# Patient Record
Sex: Female | Born: 1980 | Race: White | Hispanic: No | Marital: Single | Smoking: Current every day smoker
Health system: Southern US, Community
[De-identification: ages and names within clinical notes are randomized; demographics above are authoritative.]

## PROBLEM LIST (undated history)

## (undated) DIAGNOSIS — G8929 Other chronic pain: Secondary | ICD-10-CM

## (undated) DIAGNOSIS — F419 Anxiety disorder, unspecified: Secondary | ICD-10-CM

## (undated) DIAGNOSIS — G43909 Migraine, unspecified, not intractable, without status migrainosus: Secondary | ICD-10-CM

## (undated) DIAGNOSIS — G4700 Insomnia, unspecified: Secondary | ICD-10-CM

## (undated) DIAGNOSIS — F32A Depression, unspecified: Secondary | ICD-10-CM

## (undated) HISTORY — PX: DILATION AND CURETTAGE OF UTERUS: SHX78

## (undated) HISTORY — PX: TUBAL LIGATION: SHX77

## (undated) HISTORY — DX: Insomnia, unspecified: G47.00

## (undated) HISTORY — DX: Anxiety disorder, unspecified: F41.9

---
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
## 2005-06-16 ENCOUNTER — Emergency Department (HOSPITAL_COMMUNITY): Admission: EM | Admit: 2005-06-16 | Discharge: 2005-06-16 | Payer: Self-pay | Admitting: Emergency Medicine

## 2005-10-17 ENCOUNTER — Emergency Department (HOSPITAL_COMMUNITY): Admission: EM | Admit: 2005-10-17 | Discharge: 2005-10-17 | Payer: Self-pay | Admitting: Emergency Medicine

## 2007-09-13 ENCOUNTER — Emergency Department (HOSPITAL_COMMUNITY): Admission: EM | Admit: 2007-09-13 | Discharge: 2007-09-13 | Payer: Self-pay | Admitting: Family Medicine

## 2007-09-14 ENCOUNTER — Emergency Department (HOSPITAL_COMMUNITY): Admission: EM | Admit: 2007-09-14 | Discharge: 2007-09-14 | Payer: Self-pay | Admitting: Emergency Medicine

## 2008-06-22 ENCOUNTER — Emergency Department (HOSPITAL_COMMUNITY): Admission: EM | Admit: 2008-06-22 | Discharge: 2008-06-22 | Payer: Self-pay | Admitting: Emergency Medicine

## 2008-06-29 ENCOUNTER — Emergency Department (HOSPITAL_BASED_OUTPATIENT_CLINIC_OR_DEPARTMENT_OTHER): Admission: EM | Admit: 2008-06-29 | Discharge: 2008-06-29 | Payer: Self-pay | Admitting: Emergency Medicine

## 2009-10-05 ENCOUNTER — Ambulatory Visit: Payer: Self-pay | Admitting: Family Medicine

## 2009-10-05 DIAGNOSIS — N3000 Acute cystitis without hematuria: Secondary | ICD-10-CM | POA: Insufficient documentation

## 2009-10-05 LAB — CONVERTED CEMR LAB
Nitrite: POSITIVE
Protein, U semiquant: 100
pH: 7

## 2009-10-06 ENCOUNTER — Encounter: Payer: Self-pay | Admitting: Family Medicine

## 2009-11-22 ENCOUNTER — Emergency Department (HOSPITAL_BASED_OUTPATIENT_CLINIC_OR_DEPARTMENT_OTHER): Admission: EM | Admit: 2009-11-22 | Discharge: 2009-11-22 | Payer: Self-pay | Admitting: Emergency Medicine

## 2009-11-27 ENCOUNTER — Emergency Department (HOSPITAL_BASED_OUTPATIENT_CLINIC_OR_DEPARTMENT_OTHER): Admission: EM | Admit: 2009-11-27 | Discharge: 2009-11-28 | Payer: Self-pay | Admitting: Emergency Medicine

## 2009-11-29 ENCOUNTER — Ambulatory Visit (HOSPITAL_BASED_OUTPATIENT_CLINIC_OR_DEPARTMENT_OTHER): Admission: RE | Admit: 2009-11-29 | Discharge: 2009-11-29 | Payer: Self-pay | Admitting: Emergency Medicine

## 2009-11-29 ENCOUNTER — Ambulatory Visit: Payer: Self-pay | Admitting: Diagnostic Radiology

## 2010-01-09 IMAGING — CT CT PELVIS W/O CM
2 of 4 series · 17 of 46 positions shown, 19 images · non-contrast
Comparison: None

CT ABDOMEN

CLINICAL DATA: Bilateral flank pain.  Abdominal pain and nausea
and vomiting.

CT ABDOMEN AND PELVIS WITHOUT CONTRAST
TECHNIQUE: Multidetector CT imaging of the abdomen and pelvis was
performed following the standard
protocol without intravenous contrast.

[Series 2: 160 stone 5.0 b40f st · axial · 0.60mm/px · z∈[-412,-96]mm · 14 of 69 slices shown, 16 images]
[im 3/69  soft-tissue]
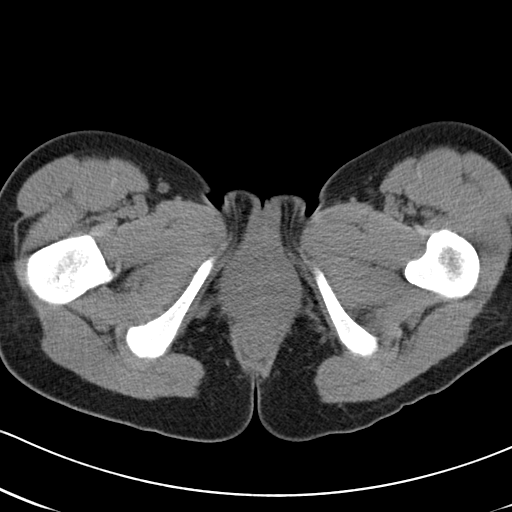
[im 3/69  bone]
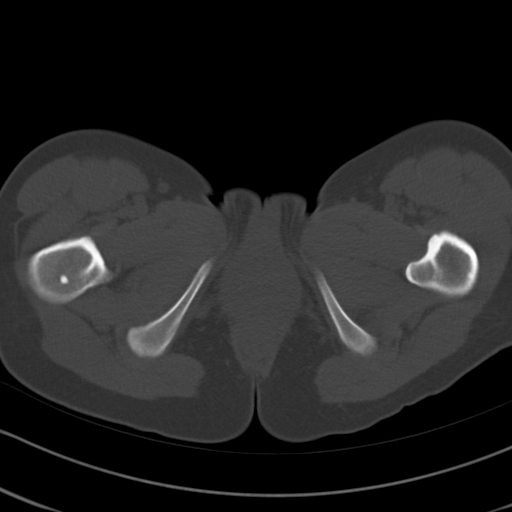
[im 8/69  soft-tissue]
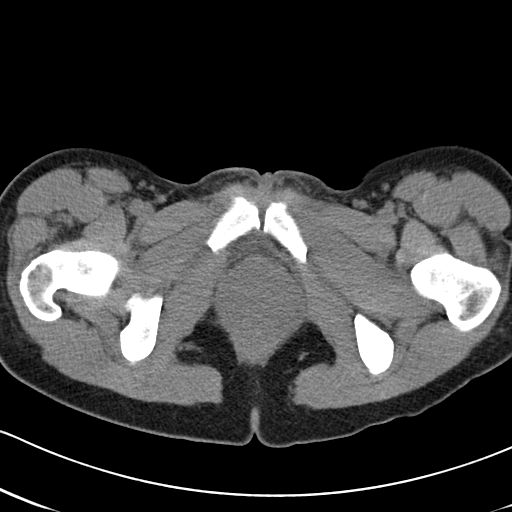
[im 13/69  soft-tissue]
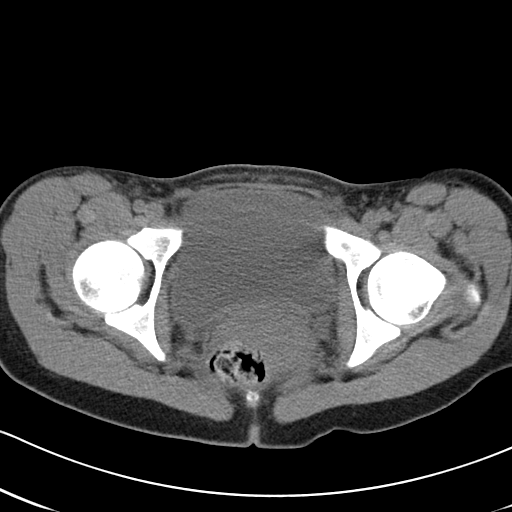
[im 18/69  soft-tissue]
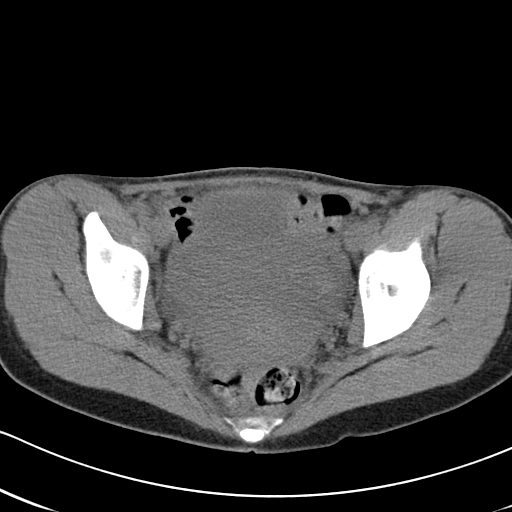
[im 22/69  soft-tissue]
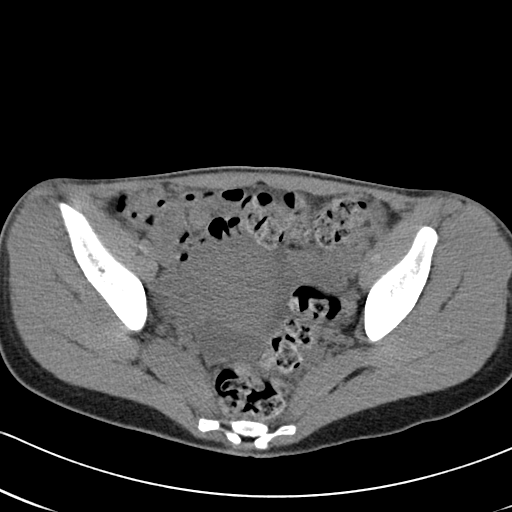
[im 27/69  soft-tissue]
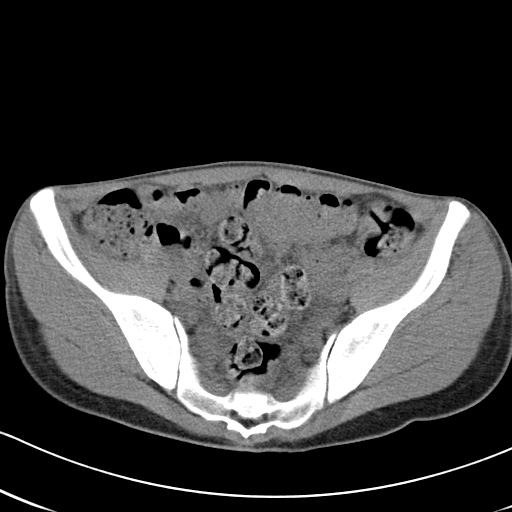
[im 32/69  soft-tissue]
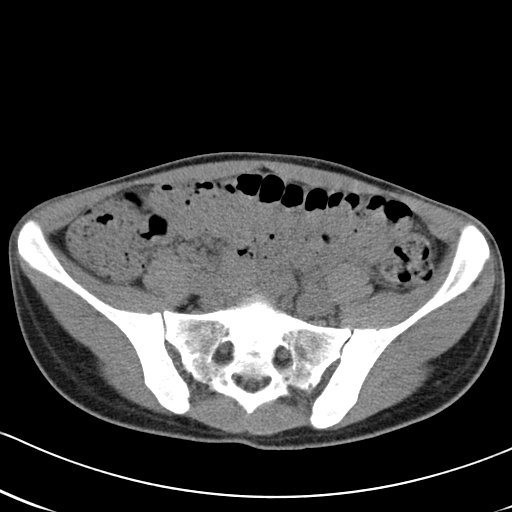
[im 37/69  soft-tissue]
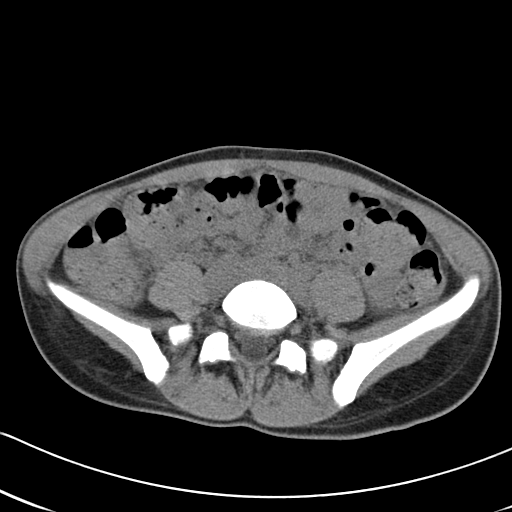
[im 42/69  soft-tissue]
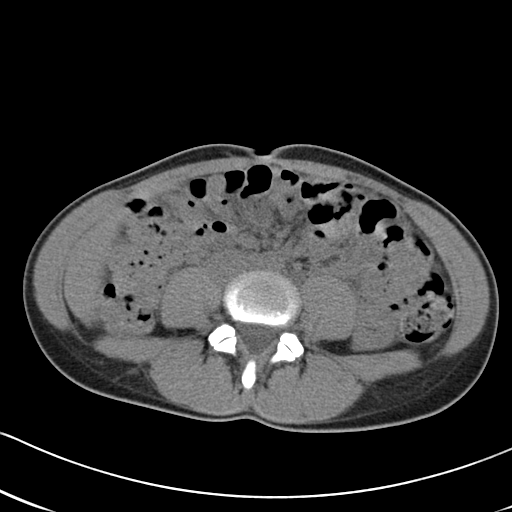
[im 42/69  bone]
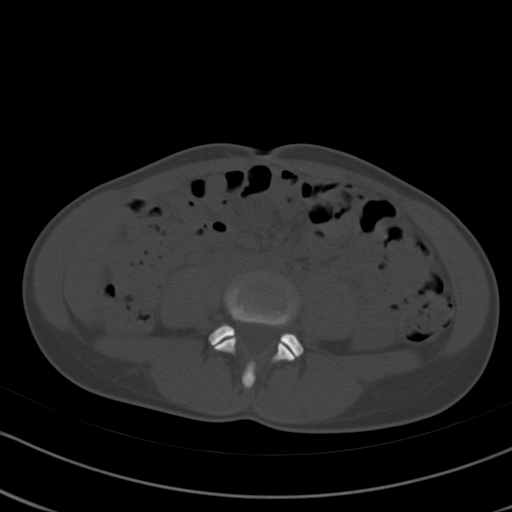
[im 47/69  soft-tissue]
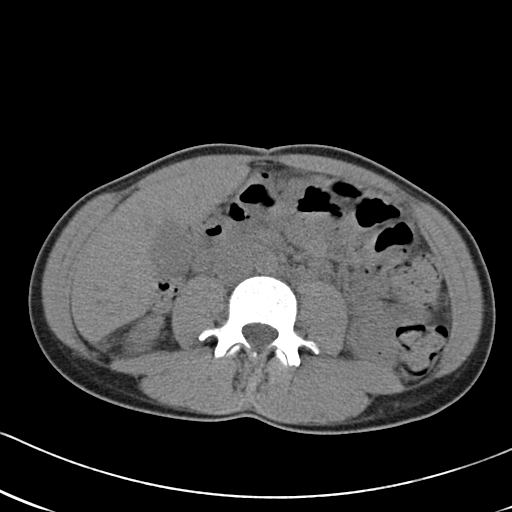
[im 51/69  soft-tissue]
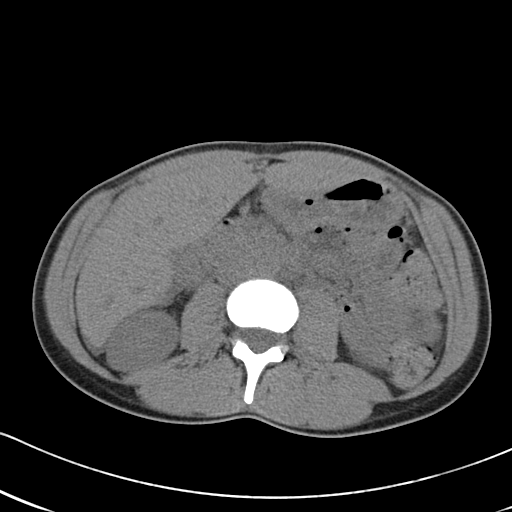
[im 56/69  soft-tissue]
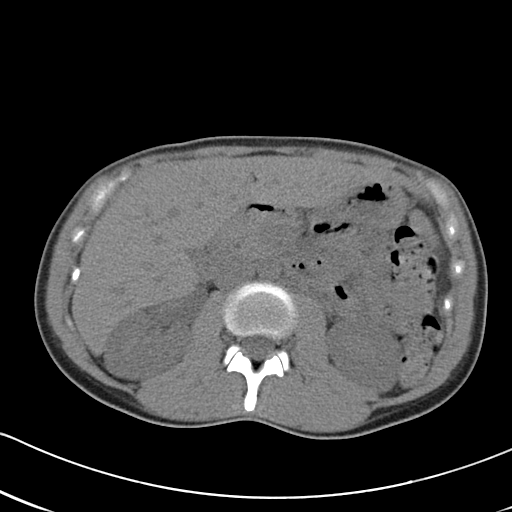
[im 61/69  soft-tissue]
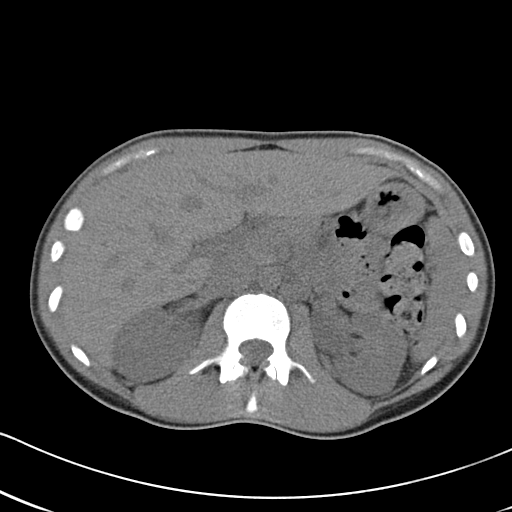
[im 66/69  soft-tissue]
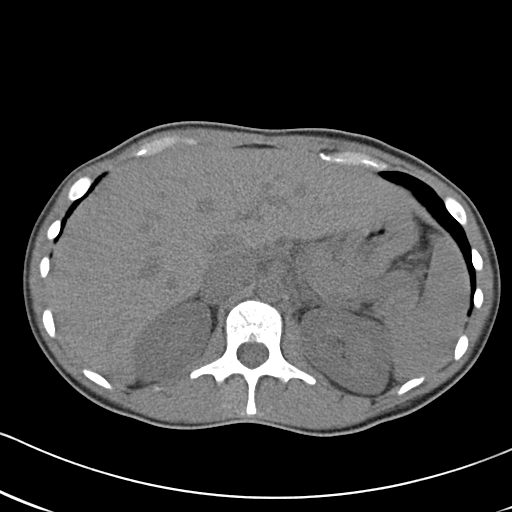

[Series 602: coronal images · coronal · 0.71mm/px · 3 of 59 slices shown]
[im 20/59  soft-tissue]
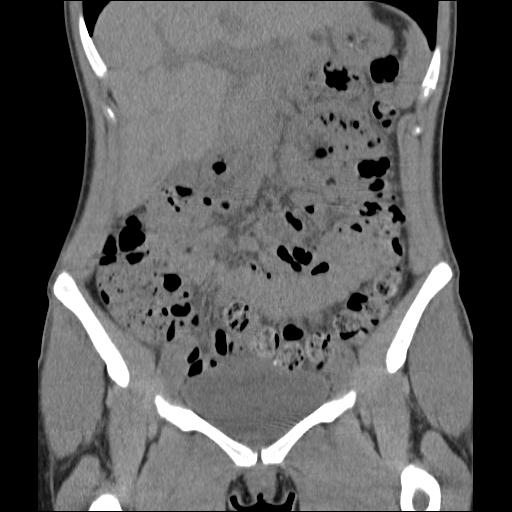
[im 26/59  soft-tissue]
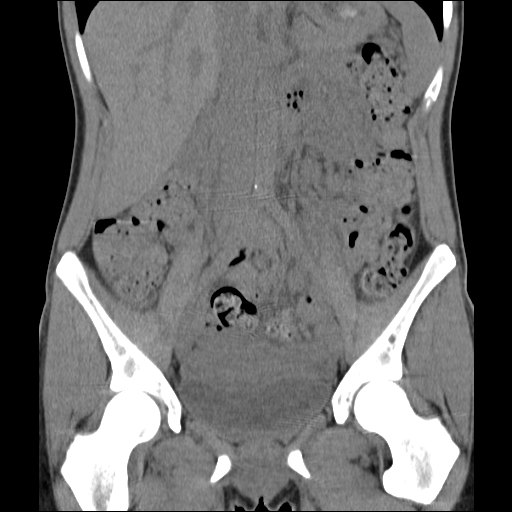
[im 33/59  soft-tissue]
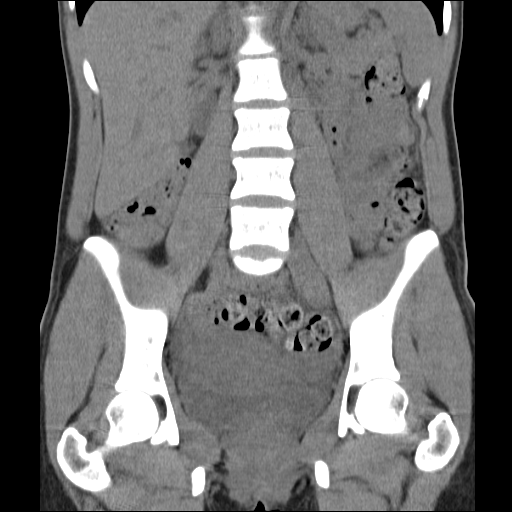

[17 of 46 positions shown; findings below may reference images not displayed]

FINDINGS: Negative for renal calculi or hydronephrosis.  The liver
and spleen and pancreas are normal.  The bowel is nondilated.

There is a small amount of free fluid in the hepatorenal space.
There is also a small amount of free fluid in the pelvis.
IMPRESSION: Negative for renal calculi

Small amount of free fluid in the peritoneal cavity

CT PELVIS
FINDINGS: There is a small amount of free fluid in the pelvis.
There appear to be clips on the fallopian tubes bilaterally.
There is question of a complex cyst on the right ovary but this
could be due to adjacent bowel.  The bowel is not dilated.  The
appendix is not visualized but no thickened bowel loops are
identified.
IMPRESSION: Small amount of free fluid the pelvis of uncertain etiology.  This
could be related to ruptured ovarian cyst or other bowel pathology.
Depending on the symptoms it may be worthwhile performing a pelvic
ultrasound or CT abdomen pelvis with oral contrast and IV contrast
for further evaluation.

The appendix is not visualized.

## 2010-03-07 ENCOUNTER — Ambulatory Visit: Payer: Self-pay | Admitting: Family Medicine

## 2010-03-07 LAB — CONVERTED CEMR LAB
Nitrite: NEGATIVE
Urobilinogen, UA: 0.2
pH: 6

## 2010-03-08 ENCOUNTER — Encounter: Payer: Self-pay | Admitting: Family Medicine

## 2011-01-10 NOTE — Assessment & Plan Note (Signed)
Summary: POSSIBLE UTI   Vital Signs:  Patient Profile:   30 Years Old Female CC:      Dysuria x 2 days Height:     63 inches Weight:      115 pounds O2 Sat:      100 % O2 treatment:    Room Air Temp:     97.2 degrees F oral Pulse rate:   92 / minute Pulse rhythm:   regular Resp:     12 per minute BP sitting:   109 / 71  (right arm) Cuff size:   regular  Vitals Entered By: Emilio Math (March 07, 2010 1:11 PM)                  Current Allergies (reviewed today): ! MOTRINHistory of Present Illness Chief Complaint: Dysuria x 2 days History of Present Illness: Subjective:  Patient presents complaining of UTI symptoms that started last night.  Complains of dysuria, frequency, and urgency.  No hematuria or nocturia.  No abnormal vaginal discharge.  No fever/chills/sweats.  No abdominal pain.  No flank pain.  No nausea/vomiting.    REVIEW OF SYSTEMS Constitutional Symptoms      Denies fever, chills, night sweats, weight loss, weight gain, and fatigue.  Eyes       Denies change in vision, eye pain, eye discharge, glasses, contact lenses, and eye surgery. Ear/Nose/Throat/Mouth       Denies hearing loss/aids, change in hearing, ear pain, ear discharge, dizziness, frequent runny nose, frequent nose bleeds, sinus problems, sore throat, hoarseness, and tooth pain or bleeding.  Respiratory       Denies dry cough, productive cough, wheezing, shortness of breath, asthma, bronchitis, and emphysema/COPD.  Cardiovascular       Denies murmurs, chest pain, and tires easily with exhertion.    Gastrointestinal       Denies stomach pain, nausea/vomiting, diarrhea, constipation, blood in bowel movements, and indigestion. Genitourniary       Complains of painful urination.      Denies kidney stones and loss of urinary control. Neurological       Denies paralysis, seizures, and fainting/blackouts. Musculoskeletal       Denies muscle pain, joint pain, joint stiffness, decreased range of motion,  redness, swelling, muscle weakness, and gout.  Skin       Denies bruising, unusual mles/lumps or sores, and hair/skin or nail changes.  Psych       Denies mood changes, temper/anger issues, anxiety/stress, speech problems, depression, and sleep problems.  Past History:  Past Medical History: Reviewed history from 10/05/2009 and no changes required. Unremarkable  Past Surgical History: Tubal ligation  Family History: Mother, Healthy Father, Diabetes   Objective:  No acute distress  Mouth:  moist mucous membranes  Neck:  Supple.  No adenopathy is present.   Lungs:  Clear to auscultation.  Breath sounds are equal.  Heart:  Regular rate and rhythm without murmurs, rubs, or gallops.  Abdomen:  Nontender without masses or hepatosplenomegaly.  Bowel sounds are present.  No CVA or flank tenderness.  urinalysis (dipstick):  mod blood Assessment New Problems: CYSTITIS, ACUTE (ICD-595.0)   Plan New Medications/Changes: CIPROFLOXACIN HCL 500 MG TABS (CIPROFLOXACIN HCL) 1 by mouth q12hr  #10 x 0, 03/07/2010, Donna Christen MD  New Orders: Urinalysis [[id number]] T-Culture, Urine [[id number]] Est. Patient Level III [41324] Planning Comments:   Culture pending. Cipro 500mg  two times a day for 5 days.  Increase fluids. Follow-up with PCP if not improving 4 to  5 days or if symptoms become worse.   The patient and/or caregiver has been counseled thoroughly with regard to medications prescribed including dosage, schedule, interactions, rationale for use, and possible side effects and they verbalize understanding.  Diagnoses and expected course of recovery discussed and will return if not improved as expected or if the condition worsens. Patient and/or caregiver verbalized understanding.  Prescriptions: CIPROFLOXACIN HCL 500 MG TABS (CIPROFLOXACIN HCL) 1 by mouth q12hr  #10 x 0   Entered and Authorized by:   Donna Christen MD   Signed by:   Donna Christen MD on 03/07/2010   Method  used:   Print then Give to Patient   RxID:   [id number]   Laboratory Results   Urine Tests  Date/Time Received: March 07, 2010 1:22 PM  Date/Time Reported: March 07, 2010 1:22 PM   Routine Urinalysis   Color: yellow Appearance: Cloudy Glucose: negative   (Normal Range: Negative) Bilirubin: negative   (Normal Range: Negative) Ketone: negative   (Normal Range: Negative) Spec. Gravity: 1.025   (Normal Range: 1.003-1.035) Blood: moderate   (Normal Range: Negative) pH: 6.0   (Normal Range: 5.0-8.0) Protein: trace   (Normal Range: Negative) Urobilinogen: 0.2   (Normal Range: 0-1) Nitrite: negative   (Normal Range: Negative) Leukocyte Esterace: trace   (Normal Range: Negative)

## 2011-03-13 LAB — URINE CULTURE: Culture: NO GROWTH

## 2011-03-13 LAB — URINALYSIS, ROUTINE W REFLEX MICROSCOPIC
Bilirubin Urine: NEGATIVE
Glucose, UA: NEGATIVE mg/dL
Ketones, ur: NEGATIVE mg/dL
Leukocytes, UA: NEGATIVE
Nitrite: POSITIVE — AB
Protein, ur: NEGATIVE mg/dL
Specific Gravity, Urine: 1.038 — ABNORMAL HIGH (ref 1.005–1.030)
Urobilinogen, UA: 1 mg/dL (ref 0.0–1.0)
Urobilinogen, UA: 1 mg/dL (ref 0.0–1.0)

## 2011-03-13 LAB — URINE MICROSCOPIC-ADD ON

## 2011-03-13 LAB — WET PREP, GENITAL
Clue Cells Wet Prep HPF POC: NONE SEEN
Yeast Wet Prep HPF POC: NONE SEEN

## 2011-03-14 LAB — URINE MICROSCOPIC-ADD ON

## 2011-03-14 LAB — PREGNANCY, URINE

## 2011-03-14 LAB — URINALYSIS, ROUTINE W REFLEX MICROSCOPIC
Nitrite: NEGATIVE
Specific Gravity, Urine: 1.036 — ABNORMAL HIGH (ref 1.005–1.030)
pH: 6 (ref 5.0–8.0)

## 2011-06-18 IMAGING — US US PELVIS COMPLETE MODIFY
1 series · 14 of 25 positions shown · non-contrast
Comparison: None.

CLINICAL DATA: Pelvic pressure, fatigue.

TRANSABDOMINAL AND TRANSVAGINAL ULTRASOUND OF PELVIS
TECHNIQUE: Both transabdominal and transvaginal ultrasound
examinations of the pelvis were performed including evaluation of
the uterus, ovaries, adnexal regions, and pelvic cul-de-sac.

[Series 1: us pelvis complete modify · 0.20mm/px · 14 of 58 slices shown]
[im 1/58]
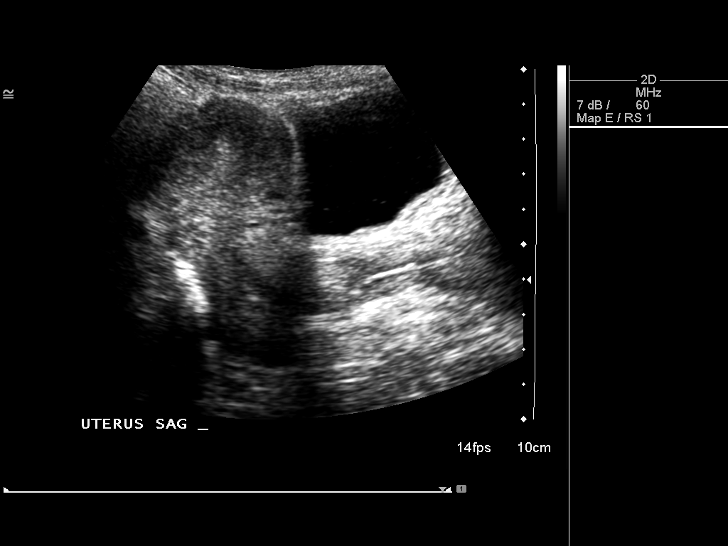
[im 5/58]
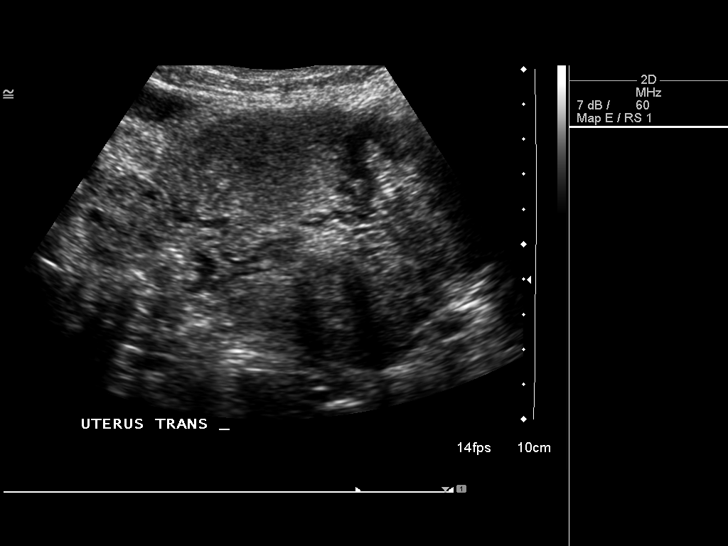
[im 10/58]
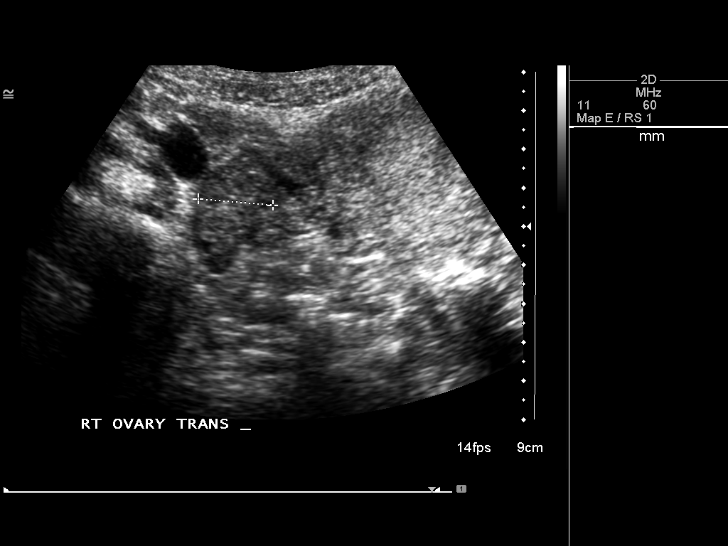
[im 15/58]
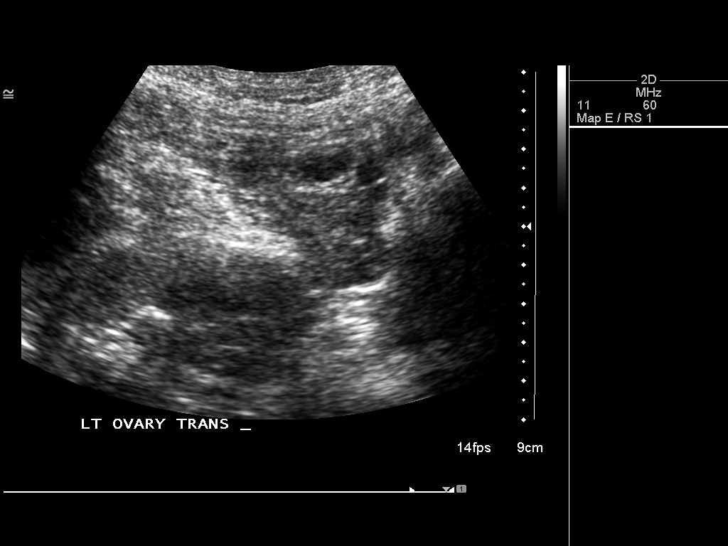
[im 20/58]
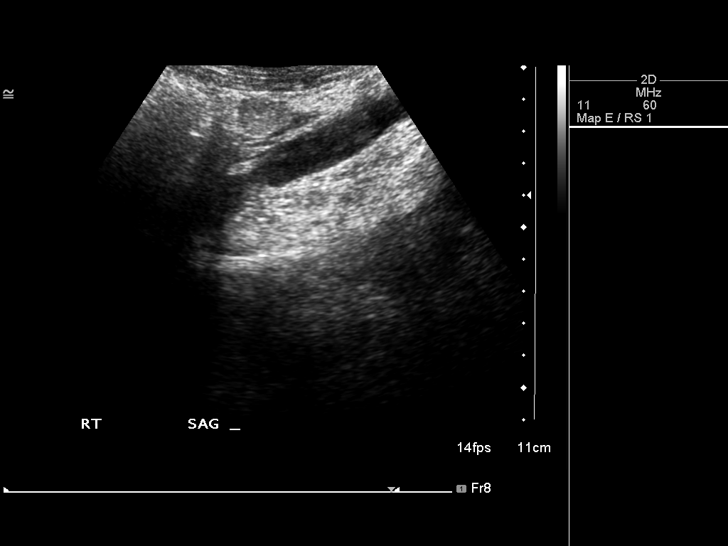
[im 22/58]
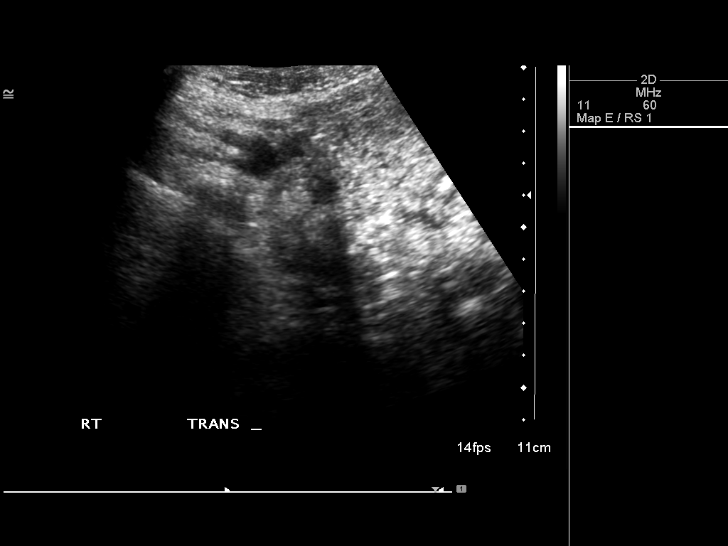
[im 27/58]
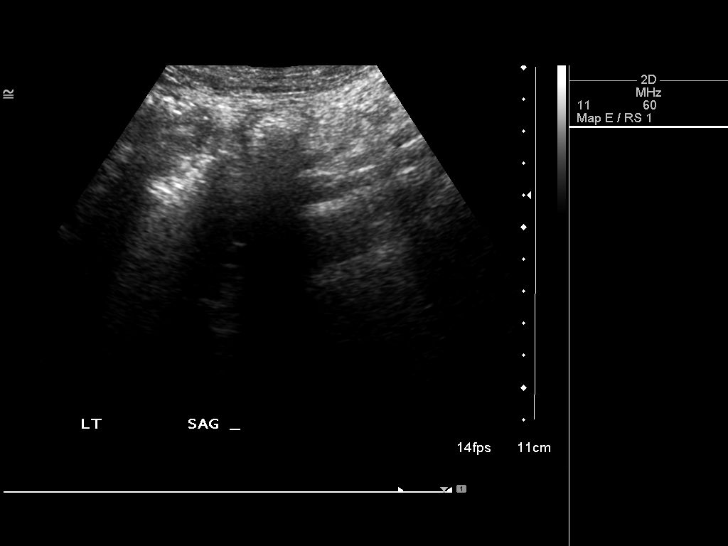
[im 31/58]
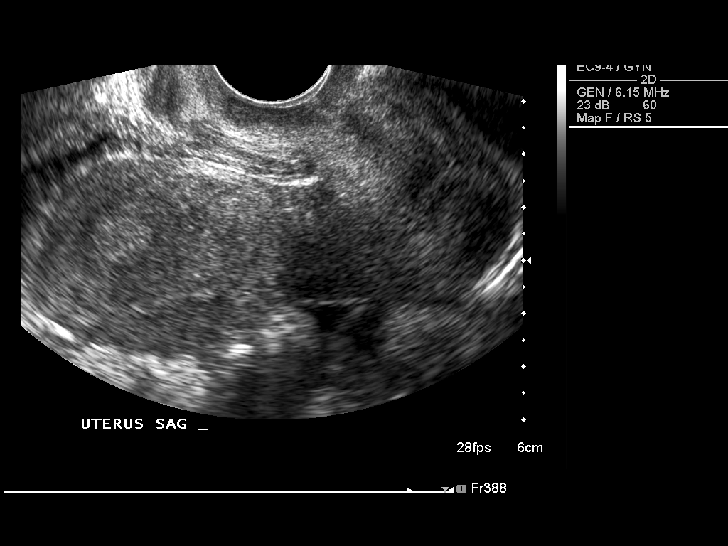
[im 36/58]
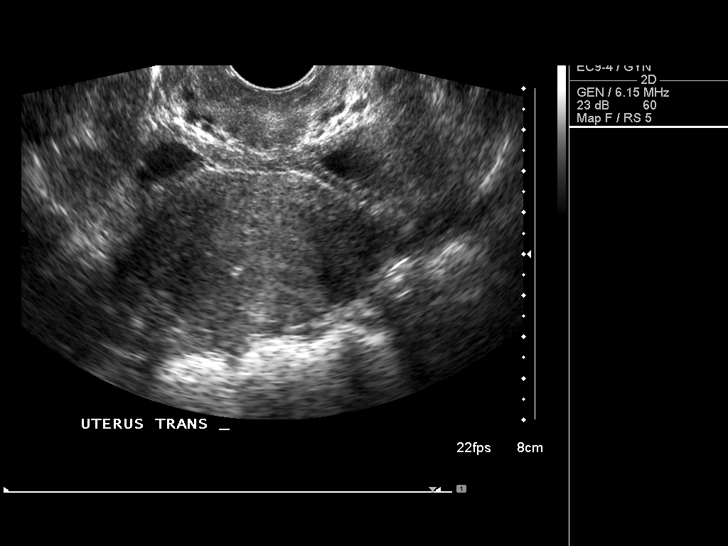
[im 39/58]
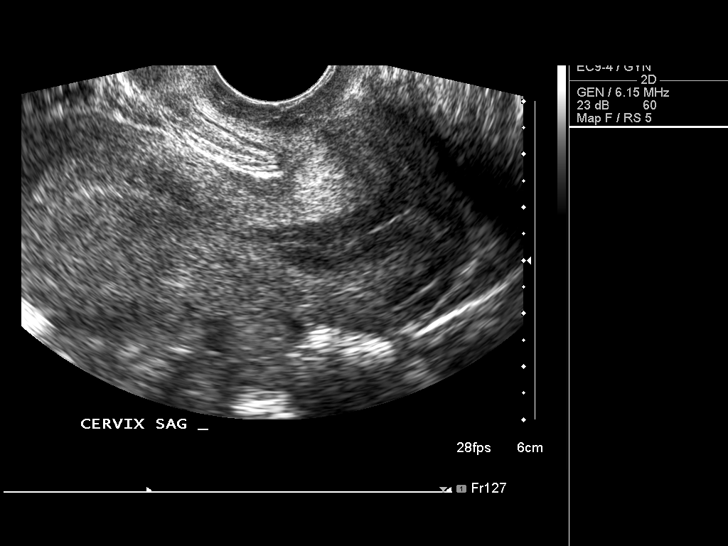
[im 43/58]
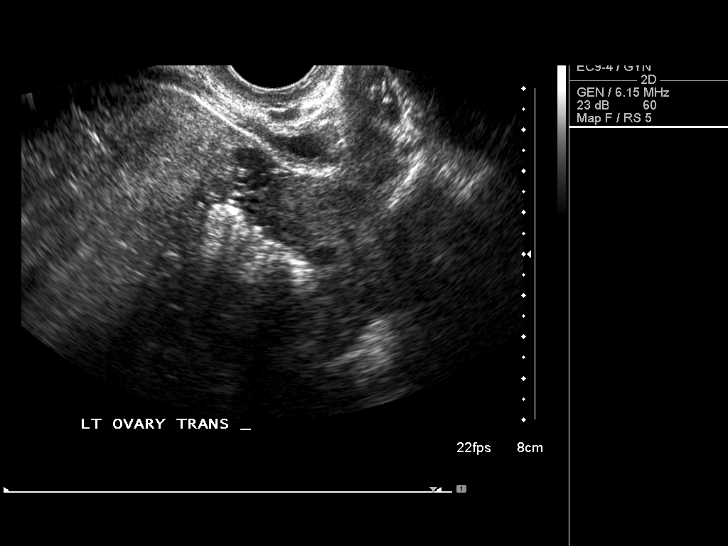
[im 48/58]
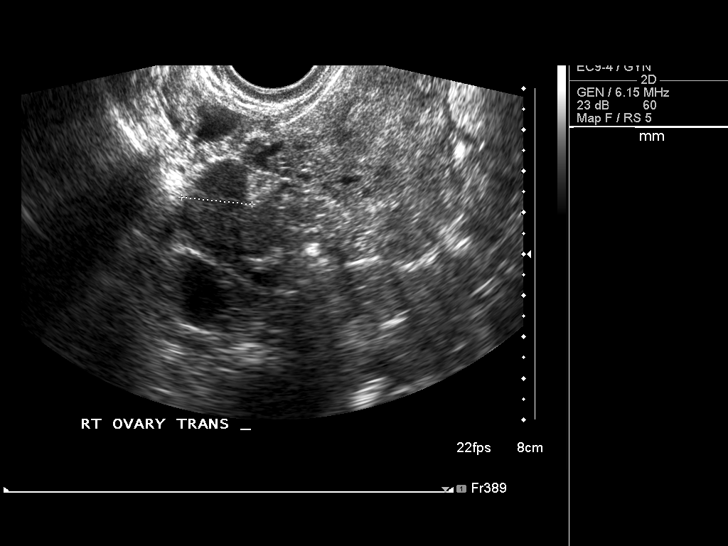
[im 53/58]
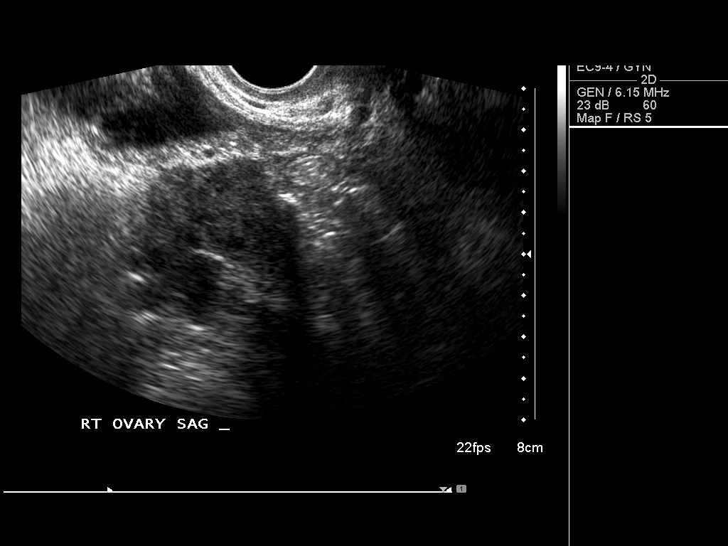
[im 58/58]
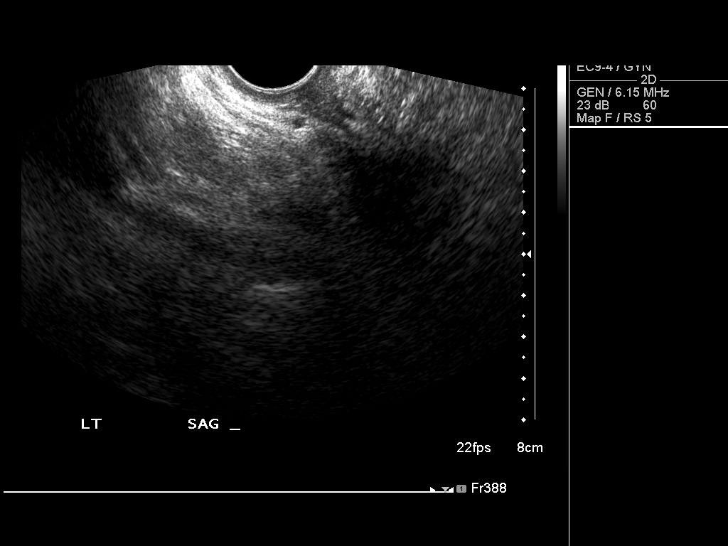

[14 of 25 positions shown; findings below may reference images not displayed]

FINDINGS: Uterus: Uterus measures 9.1 x 4.0 x 5.8 cm.  No focal abnormality.
Normal echotexture.

Endometrium: Normal, 11 mm.

Right Ovary: Normal size and echotexture.  No adnexal masses.

Left Ovary: Normal size and echotexture.  No adnexal masses.

Other Findings:  No free fluid.
IMPRESSION: Unremarkable pelvic ultrasound.

## 2011-09-07 LAB — CBC
HCT: 39.2
Hemoglobin: 13.3
Platelets: 215
WBC: 6.4

## 2011-09-07 LAB — DIFFERENTIAL
Eosinophils Relative: 2
Lymphocytes Relative: 24
Lymphs Abs: 1.5

## 2011-09-07 LAB — POCT I-STAT, CHEM 8
BUN: 13
Creatinine, Ser: 0.8
Sodium: 140
TCO2: 25

## 2011-09-07 LAB — URINALYSIS, ROUTINE W REFLEX MICROSCOPIC
Bilirubin Urine: NEGATIVE
Ketones, ur: NEGATIVE
Protein, ur: NEGATIVE
Urobilinogen, UA: 0.2

## 2011-09-07 LAB — POCT PREGNANCY, URINE

## 2012-01-23 DIAGNOSIS — F410 Panic disorder [episodic paroxysmal anxiety] without agoraphobia: Secondary | ICD-10-CM | POA: Insufficient documentation

## 2012-08-06 ENCOUNTER — Encounter: Payer: Self-pay | Admitting: Family Medicine

## 2012-08-06 ENCOUNTER — Ambulatory Visit (INDEPENDENT_AMBULATORY_CARE_PROVIDER_SITE_OTHER): Payer: Managed Care, Other (non HMO) | Admitting: Family Medicine

## 2012-08-06 VITALS — BP 108/62 | HR 101 | Temp 98.3°F | Ht 63.75 in | Wt 113.0 lb

## 2012-08-06 DIAGNOSIS — M549 Dorsalgia, unspecified: Secondary | ICD-10-CM

## 2012-08-06 DIAGNOSIS — S239XXA Sprain of unspecified parts of thorax, initial encounter: Secondary | ICD-10-CM

## 2012-08-06 DIAGNOSIS — F419 Anxiety disorder, unspecified: Secondary | ICD-10-CM | POA: Insufficient documentation

## 2012-08-06 DIAGNOSIS — F411 Generalized anxiety disorder: Secondary | ICD-10-CM

## 2012-08-06 DIAGNOSIS — G4700 Insomnia, unspecified: Secondary | ICD-10-CM

## 2012-08-06 DIAGNOSIS — S29012A Strain of muscle and tendon of back wall of thorax, initial encounter: Secondary | ICD-10-CM

## 2012-08-06 DIAGNOSIS — G8929 Other chronic pain: Secondary | ICD-10-CM

## 2012-08-06 DIAGNOSIS — F329 Major depressive disorder, single episode, unspecified: Secondary | ICD-10-CM

## 2012-08-06 MED ORDER — TRAZODONE HCL 50 MG PO TABS: 50.0000 mg | ORAL_TABLET | Freq: Every day | ORAL | Status: AC

## 2012-08-06 NOTE — Progress Notes (Signed)
CC: Shelly Cardenas is a 31 y.o. female is here for Establish Care, Neck Pain and Shoulder Pain   Subjective: HPI:  Patient presents to establish care, requesting refills, concerned about neck pain.  Recent past medical history is significant for a suspicious Pap which she says that she's set up for colposcopy next week with [facility].  She expresses a desire to have our office manage her Xanax and hydrocodone prescriptions. She feels that she's been on both for matter of years and the Xanax appears to be not as effective as it was in the past. She denies ever being on any other psych her mood medications other than BuSpar which did not help much. Her biggest complaint is that the Xanax to help her sleep and that she's having trouble with getting to and staying asleep. She also endorses lack of interest to be social and lack of desire to engage in all hobbies. She attributes this mood to recent breakup with her significant other for the past 16 years. She reports taking 5 mg of Xanax up to 4 times a day mostly to handle his panic attacks at bay.  Discussed the possibility of refilling her Xanax she tells me was last filled in July of 2013 and that she's now. Referencing [place] controlled substance database shows that it was last filled on the third of this month. It also appears that the potential substances have been filled by various consecutive providers for the past year. She is unable to elaborate on any psychological counseling or orthopedic workup for her Xanax and hydrocodone prescriptions respectively.  On Friday of this last week while cracking her neck she experienced a discomfort in the low right and left lateral components of her neck which eventually radiated and has not set up to a dull discomfort laterally to th the spinous processes between the shoulder blades. Presently 24 hours a day, nothing makes better or worse other than stretching makes it more tight. Has  tried NSAIDs and Tylenol without much improvement, has also tried Flexeril without improvement. Hot compresses not helping. Pain is not related to exertion or breathing cycle. She denies radiculopathy-type symptoms coming from the neck nor any history of trauma to the neck. Denies any motor or sensory disturbances in the upper extremity. Denies cough, shortness of breath, fevers, chills.      Review Of Systems Outlined In HPI  Past Medical History  Diagnosis Date  . Anxiety   . Insomnia      Family History  Problem Relation Age of Onset  . Hypertension Mother   . Cancer Father     prostate  . Diabetes Father   . Hypertension Maternal Grandmother   . Hypertension Maternal Grandfather   . Diabetes Paternal Grandmother   . Hypertension Paternal Grandmother      History  Substance Use Topics  . Smoking status: Current Everyday Smoker -- 0.5 packs/day for 13 years  . Smokeless tobacco: Never Used  . Alcohol Use: No     Objective: Filed Vitals:   08/06/12 0837  BP: 108/62  Pulse: 101  Temp: 98.3 F (36.8 C)    General: Alert and Oriented, No Acute Distress HEENT: Pupils equal, round, reactive to light. Conjunctivae clear.  External ears unremarkable, canals clear with intact TMs with appropriate landmarks. pharynx without inflammation nor lesions.  Neck supple without palpable lymphadenopathy nor abnormal masses. Lungs:  Comfortable work of breathing. Good air movement. Cardiac: Regular rate and rhythm. Normal S1/S2.  No murmurs,  rubs, nor gallops.   Extremities: No peripheral edema.  Strong peripheral pulses.  Mental Status: No depression, anxiety, nor agitation. MSK: no midline cervical/thoracic tenderness, pain reproduced with palpation of both rhomboids Neuro: CN II-XII grossly intact, full strength/rom of all four extremities, gait normal, rapid alternating movements normal, heel-shin test normal, negative spurlings bilaterally Skin: Warm and dry.   Assessment &  Plan: Rondell was seen today for establish care, neck pain and shoulder pain.  Diagnoses and associated orders for this visit:  Rhomboid muscle strain  Insomnia - traZODone (DESYREL) 50 MG tablet; Take 1 tablet (50 mg total) by mouth at bedtime.  Anxiety - Ambulatory referral to Psychiatry  Chronic back pain  Depression - Ambulatory referral to Psychiatry   Discussed with the patient that due to inconsistencies in her referral report and that listed on the [place] controlled substance database I do not feel comfortable prescribing controlled substances today. Offered a referral to pain management clinic however she states this is been proposed for she's not interested. With this news she then expressed interest in getting off of his medications while minimizing withdrawal symptoms. Patient handout I gave her resources to a local opiate addiction clinic and also counseling clinic advertises substance abuse outpatient treatment among other counseling topics. I strongly encouraged her to seek counseling and/or psychiatry as I believe she would greatly benefit from medication such as SSRIs are SNRIs along with personalized therapy, she was open to get a referral however I do believe [facility] offers the services.  No red flags on back exam/nor exam, suspect rhomboid strain, continue over-the-counter regimen at home but given her sample for Skelaxin. Also gave handout on rhomboid rehabilitation. Will attempt to control or improve her insomnia which a trial trazodone.      Return if symptoms worsen or fail to improve.  Requested Prescriptions   Signed Prescriptions Disp Refills  . traZODone (DESYREL) 50 MG tablet 30 tablet 2    Sig: Take 1 tablet (50 mg total) by mouth at bedtime.

## 2012-08-06 NOTE — Patient Instructions (Addendum)
Skelaxin 800mg  Samples: Try one pill three to four times daily as needed for shoulder blade and neck pain, if you would like a Rx of this please let me know and I'd be happy to call in a longer Rx. Try to do the back exercises I gave you daily for 1-2 weeks, if no improvement we can set you up for formal physical therapy if you'd like, or if the symptoms change I'd encourage a re-evaluation in clinic.  I've placed a referral to our behavorial health department, I'd encourage you to see a specialist to discuss the best management of depression and anxiety. If you haven't heard anything about an appt from them in the next two weeks feel free to call me and I'll see what the hold up is.  [facility]: [place] [facility] offers substance abuse and mental health services for adults and adolescents, as well as opioid treatment. This is also the location of the corporate office. [address] [place], [place] [address] Phone: [phone]  [facility]: Phone: [phone] Fax: [phone] Address: [address] [place], [place] [address] Hours of Operation: Monday - Friday 8am to 5pm Services Offered Substance Abuse Outpatient Treatment  Mental Health Outpatient Treatment  Substance Abuse Intensive Outpatient Program (SAIOP)  Psychiatric Services/Medical Services  Emergency Services  Mobile Crisis Management Services  Targeted Case Management Services  Peer Support Services

## 2012-08-23 ENCOUNTER — Other Ambulatory Visit: Payer: Self-pay | Admitting: Family Medicine

## 2012-08-30 DIAGNOSIS — N83209 Unspecified ovarian cyst, unspecified side: Secondary | ICD-10-CM | POA: Insufficient documentation

## 2013-10-31 DIAGNOSIS — N200 Calculus of kidney: Secondary | ICD-10-CM | POA: Insufficient documentation

## 2014-08-12 DIAGNOSIS — Z833 Family history of diabetes mellitus: Secondary | ICD-10-CM | POA: Insufficient documentation

## 2014-08-12 DIAGNOSIS — M5030 Other cervical disc degeneration, unspecified cervical region: Secondary | ICD-10-CM | POA: Insufficient documentation

## 2015-07-05 DIAGNOSIS — G43109 Migraine with aura, not intractable, without status migrainosus: Secondary | ICD-10-CM | POA: Insufficient documentation

## 2015-12-23 ENCOUNTER — Emergency Department
Admission: EM | Admit: 2015-12-23 | Discharge: 2015-12-23 | Disposition: A | Discharge status: Home / Self Care | Payer: BLUE CROSS/BLUE SHIELD | Source: Home / Self Care | Attending: Family Medicine | Admitting: Family Medicine

## 2015-12-23 ENCOUNTER — Encounter: Payer: Self-pay | Admitting: Emergency Medicine

## 2015-12-23 ENCOUNTER — Emergency Department (INDEPENDENT_AMBULATORY_CARE_PROVIDER_SITE_OTHER): Payer: BLUE CROSS/BLUE SHIELD

## 2015-12-23 DIAGNOSIS — K5900 Constipation, unspecified: Secondary | ICD-10-CM

## 2015-12-23 DIAGNOSIS — R1032 Left lower quadrant pain: Secondary | ICD-10-CM

## 2015-12-23 DIAGNOSIS — R634 Abnormal weight loss: Secondary | ICD-10-CM

## 2015-12-23 DIAGNOSIS — R5382 Chronic fatigue, unspecified: Secondary | ICD-10-CM | POA: Diagnosis not present

## 2015-12-23 HISTORY — DX: Depression, unspecified: F32.A

## 2015-12-23 HISTORY — DX: Other chronic pain: G89.29

## 2015-12-23 HISTORY — DX: Anxiety disorder, unspecified: F41.9

## 2015-12-23 LAB — POCT URINALYSIS DIP (MANUAL ENTRY)
Bilirubin, UA: NEGATIVE
Glucose, UA: NEGATIVE
Leukocytes, UA: NEGATIVE
Nitrite, UA: NEGATIVE
Protein Ur, POC: 30 — AB
Spec Grav, UA: >=1.03 (ref 1.005–1.03)
Urobilinogen, UA: 0.2 (ref 0–1)
pH, UA: 6 (ref 5–8)

## 2015-12-23 LAB — POCT CBC W AUTO DIFF (K'VILLE URGENT CARE)

## 2015-12-23 LAB — POCT URINE PREGNANCY: Preg Test, Ur: NEGATIVE

## 2015-12-23 MED ORDER — CIPROFLOXACIN HCL 500 MG PO TABS: 500.0000 mg | ORAL_TABLET | Freq: Two times a day (BID) | ORAL | Status: DC

## 2015-12-23 MED ORDER — METRONIDAZOLE 500 MG PO TABS: 500.0000 mg | ORAL_TABLET | Freq: Four times a day (QID) | ORAL | Status: DC

## 2015-12-23 NOTE — ED Provider Notes (Signed)
CSN: [id number]     Arrival date & time 12/23/15  1226 History   First MD Initiated Contact with Patient 12/23/15 1319     Chief Complaint  Patient presents with  . Abdominal Pain      HPI Comments: Patient complains of onset of left abdominal pain that started about 5 days ago.  She has a history of chronic back pain for which is takes Norco on a regular basis.  On December 03, 2015, she developed left flank pain and presented to [facility] where a CT scan of abdomen was negative for kidney stone; she was diagnosed with pyelonephritis and treated with Keflex 500mg  QID for 10 days (finished Jan 2).  She states that during the past 10 days she has been fatigued with light-headedness upon standing.  She has had nausea without vomiting, and loose stools for about 10 days.  She has had poor appetite and lost weight.  No dysuria or frequency. Past surgical history of tubal ligation.  She states that for several years she has used a transdermal contraceptive for regulation of menses, and was switched by her GYN to an OCP two months ago.  She states that she has not had a period in two months; she checked a home pregnancy test several days ago which was negative.    The history is provided by the patient.    Past Medical History  Diagnosis Date  . Depression   . Anxiety   . Chronic LBP    Past Surgical History  Procedure Laterality Date  . Tubal ligation     No family history on file. Social History  Substance Use Topics  . Smoking status: Current Every Day Smoker -- 1.00 packs/day for 25 years    Types: Cigarettes  . Smokeless tobacco: None  . Alcohol Use: No   OB History    No data available     Review of Systems  Constitutional: Positive for chills, diaphoresis, activity change, appetite change, fatigue and unexpected weight change. Negative for fever.  HENT: Negative.   Eyes: Negative.   Respiratory: Negative.   Cardiovascular: Negative.   Gastrointestinal:  Positive for nausea, abdominal pain, diarrhea and constipation. Negative for vomiting, blood in stool, abdominal distention and anal bleeding.  Genitourinary: Positive for flank pain and decreased urine volume. Negative for dysuria, urgency, frequency, hematuria, vaginal bleeding, vaginal discharge, difficulty urinating, vaginal pain and pelvic pain.  Musculoskeletal: Positive for back pain. Negative for myalgias, joint swelling, neck pain and neck stiffness.  Skin: Negative.   Neurological: Positive for dizziness. Negative for headaches.  Psychiatric/Behavioral: Positive for confusion and decreased concentration.    Allergies  Review of patient's allergies indicates no known allergies.  Home Medications   Prior to Admission medications   Medication Sig Start Date End Date Taking? Authorizing Provider  ALPRAZolam Prudy Feeler) 0.5 MG tablet Take 0.5 mg by mouth 3 (three) times daily.   Yes Historical Provider, MD  eszopiclone (LUNESTA) 2 MG TABS tablet Take 2 mg by mouth at bedtime as needed for sleep. Take immediately before bedtime   Yes Historical Provider, MD  HYDROcodone-acetaminophen (NORCO) 10-325 MG tablet Take 5 tablets by mouth every 6 (six) hours as needed.   Yes Historical Provider, MD  ciprofloxacin (CIPRO) 500 MG tablet Take 1 tablet (500 mg total) by mouth 2 (two) times daily. 12/23/15   Lattie Haw, MD  metroNIDAZOLE (FLAGYL) 500 MG tablet Take 1 tablet (500 mg total) by mouth 4 (four) times daily.  12/23/15   Lattie Haw, MD   Meds Ordered and Administered this Visit  Medications - No data to display  BP 124/81 mmHg  Pulse 96  Temp(Src) 98.1 F (36.7 C) (Oral)  Ht 5\' 4"  (1.626 m)  Wt 113 lb (51.256 kg)  BMI 19.39 kg/m2  SpO2 99%  LMP 10/23/2015 Orthostatic VS for the past 24 hrs:  BP- Lying Pulse- Lying BP- Sitting Pulse- Sitting BP- Standing at 0 minutes Pulse- Standing at 0 minutes  12/23/15 1358 118/77 mmHg 81 118/82 mmHg 86 119/84 mmHg 96    Physical  Exam Nursing notes and Vital Signs reviewed. Appearance:  Patient appears stated age, and in no acute distress.   Eyes:  Pupils are equal, round, and reactive to light and accomodation.  Extraocular movement is intact.  Conjunctivae are not inflamed  Ears:  Canals normal.  Tympanic membranes normal.  Nose:  Normal turbinates.  No sinus tenderness.    Pharynx:  Normal; moist mucous membranes  Neck:  Supple.  No adenopathy or thyromegaly Lungs:  Clear to auscultation.  Breath sounds are equal.  Moving air well. Heart:  Regular rate and rhythm without murmurs, rubs, or gallops.  Abdomen:  Tenderness left lower quadrant over course of descending colon without masses or hepatosplenomegaly.  Bowel sounds are present.  No CVA tenderness, but mild left flank tenderness.  No rebound tenderness. Extremities:  No edema. No calf tenderness Skin:  No rash present.   ED Course  Procedures  None    Labs Reviewed  POCT URINALYSIS DIP (MANUAL ENTRY) - Abnormal; Notable for the following:    Clarity, UA cloudy (*)    Ketones, POC UA trace (5) (*)    Blood, UA moderate (*)    Protein Ur, POC =30 (*)    All other components within normal limits  URINE CULTURE  TSH  COMPLETE METABOLIC PANEL WITH GFR  POCT CBC W AUTO DIFF ([facility]):  WBC 11.9; LY 24.1; MO 4.3; GR 71.6; Hgb 14,3; Platelets 285   POCT URINE PREGNANCY negative    Imaging Review Dg Abd 1 View  12/23/2015  CLINICAL DATA:  Left-sided flank pain and lower abdominal pain for 5 days. EXAM: ABDOMEN - 1 VIEW COMPARISON:  None. FINDINGS: Moderate stool is present in the rectosigmoid colon without obstruction. No focal stones are present. Mild leftward curvature is present in the lumbar spine. IMPRESSION: Moderate stool in the lower rectosigmoid colon without obstruction. Electronically Signed   By: Marin Roberts M.D.   On: 12/23/2015 14:16      MDM   1. Abdominal pain, left lower quadrant; suspect diverticulitis    Patient  has a history of chronic constipation likely secondary to hydrocodone use for her chronic back pain.  Although she has had loose stools for the past 10 days, her abdominal plain films show moderate stool load in rectosigmoid colon.  Patient has mild leukocytosis, and abdominal tenderness over the rectosigmoid colon.   Having recently taken a course of cephalexin, c.diff colitis is also a possibility.  CMP is pending; will need to reassess if creatinine elevated. Also pending are urine culture, TSH Begin Cipro 500mg  BID for one week, and Flagyl 500mg  Q6h. Begin clear liquids for about 18 to 24 hours, then may begin a [employer] (Bananas, Rice, Applesauce, Toast).  Then gradually advance to a regular diet as tolerated.  Avoid milk products until well.  Minimize taking Norco for now. If symptoms become significantly worse during the night  or over the weekend, proceed to the local emergency room.  Followup with Family Doctor in one week, and to establish care.    Lattie HawStephen A Vinita Prentiss, MD 12/23/15 2039

## 2015-12-24 ENCOUNTER — Telehealth: Payer: Self-pay | Admitting: *Deleted

## 2015-12-24 LAB — COMPLETE METABOLIC PANEL WITH GFR
ALT: 12 U/L (ref 6–29)
AST: 14 U/L (ref 10–30)
Albumin: 4.6 g/dL (ref 3.6–5.1)
Alkaline Phosphatase: 37 U/L (ref 33–115)
BUN: 15 mg/dL (ref 7–25)
CO2: 22 mmol/L (ref 20–31)
Calcium: 9.6 mg/dL (ref 8.6–10.2)
Chloride: 106 mmol/L (ref 98–110)
Creat: 0.68 mg/dL (ref 0.50–1.10)
GFR, Est African American: >89 mL/min (ref >=60)
GFR, Est Non African American: >89 mL/min (ref >=60)
Glucose, Bld: 84 mg/dL (ref 65–99)
Potassium: 4.5 mmol/L (ref 3.5–5.3)
Sodium: 139 mmol/L (ref 135–146)
Total Bilirubin: 0.5 mg/dL (ref 0.2–1.2)
Total Protein: 7.1 g/dL (ref 6.1–8.1)

## 2015-12-24 LAB — TSH: TSH: 0.4 u[IU]/mL (ref 0.350–4.500)

## 2015-12-25 LAB — URINE CULTURE
Colony Count: NO GROWTH
Organism ID, Bacteria: NO GROWTH

## 2015-12-30 ENCOUNTER — Encounter: Payer: Self-pay | Admitting: Osteopathic Medicine

## 2015-12-30 ENCOUNTER — Ambulatory Visit (INDEPENDENT_AMBULATORY_CARE_PROVIDER_SITE_OTHER): Payer: BLUE CROSS/BLUE SHIELD | Admitting: Osteopathic Medicine

## 2015-12-30 VITALS — BP 115/68 | HR 100 | Ht 63.0 in | Wt 115.0 lb

## 2015-12-30 DIAGNOSIS — F411 Generalized anxiety disorder: Secondary | ICD-10-CM | POA: Insufficient documentation

## 2015-12-30 DIAGNOSIS — N911 Secondary amenorrhea: Secondary | ICD-10-CM | POA: Diagnosis not present

## 2015-12-30 DIAGNOSIS — Z13220 Encounter for screening for lipoid disorders: Secondary | ICD-10-CM | POA: Diagnosis not present

## 2015-12-30 DIAGNOSIS — K5900 Constipation, unspecified: Secondary | ICD-10-CM | POA: Diagnosis not present

## 2015-12-30 DIAGNOSIS — F17200 Nicotine dependence, unspecified, uncomplicated: Secondary | ICD-10-CM | POA: Insufficient documentation

## 2015-12-30 DIAGNOSIS — K529 Noninfective gastroenteritis and colitis, unspecified: Secondary | ICD-10-CM | POA: Insufficient documentation

## 2015-12-30 DIAGNOSIS — G894 Chronic pain syndrome: Secondary | ICD-10-CM

## 2016-03-28 DIAGNOSIS — Z79891 Long term (current) use of opiate analgesic: Secondary | ICD-10-CM | POA: Diagnosis not present

## 2016-03-28 DIAGNOSIS — M539 Dorsopathy, unspecified: Secondary | ICD-10-CM | POA: Diagnosis not present

## 2016-03-28 DIAGNOSIS — G894 Chronic pain syndrome: Secondary | ICD-10-CM | POA: Diagnosis not present

## 2016-03-28 DIAGNOSIS — M25559 Pain in unspecified hip: Secondary | ICD-10-CM | POA: Diagnosis not present

## 2016-03-28 DIAGNOSIS — M4697 Unspecified inflammatory spondylopathy, lumbosacral region: Secondary | ICD-10-CM | POA: Diagnosis not present

## 2016-03-28 DIAGNOSIS — Z79899 Other long term (current) drug therapy: Secondary | ICD-10-CM | POA: Diagnosis not present

## 2016-05-02 DIAGNOSIS — M539 Dorsopathy, unspecified: Secondary | ICD-10-CM | POA: Diagnosis not present

## 2016-05-02 DIAGNOSIS — M545 Low back pain: Secondary | ICD-10-CM | POA: Diagnosis not present

## 2016-05-02 DIAGNOSIS — M797 Fibromyalgia: Secondary | ICD-10-CM | POA: Diagnosis not present

## 2016-05-02 DIAGNOSIS — Z79899 Other long term (current) drug therapy: Secondary | ICD-10-CM | POA: Diagnosis not present

## 2016-05-02 DIAGNOSIS — G894 Chronic pain syndrome: Secondary | ICD-10-CM | POA: Diagnosis not present

## 2016-05-02 DIAGNOSIS — M4697 Unspecified inflammatory spondylopathy, lumbosacral region: Secondary | ICD-10-CM | POA: Diagnosis not present

## 2016-05-02 DIAGNOSIS — Z79891 Long term (current) use of opiate analgesic: Secondary | ICD-10-CM | POA: Diagnosis not present

## 2016-05-02 DIAGNOSIS — M5416 Radiculopathy, lumbar region: Secondary | ICD-10-CM | POA: Diagnosis not present

## 2016-05-02 DIAGNOSIS — M5136 Other intervertebral disc degeneration, lumbar region: Secondary | ICD-10-CM | POA: Diagnosis not present

## 2016-05-23 DIAGNOSIS — M5417 Radiculopathy, lumbosacral region: Secondary | ICD-10-CM | POA: Diagnosis not present

## 2016-05-29 DIAGNOSIS — M419 Scoliosis, unspecified: Secondary | ICD-10-CM | POA: Diagnosis not present

## 2016-05-30 DIAGNOSIS — M5416 Radiculopathy, lumbar region: Secondary | ICD-10-CM | POA: Diagnosis not present

## 2016-05-30 DIAGNOSIS — G894 Chronic pain syndrome: Secondary | ICD-10-CM | POA: Diagnosis not present

## 2016-05-30 DIAGNOSIS — M79604 Pain in right leg: Secondary | ICD-10-CM | POA: Diagnosis not present

## 2016-05-30 DIAGNOSIS — M5136 Other intervertebral disc degeneration, lumbar region: Secondary | ICD-10-CM | POA: Diagnosis not present

## 2016-05-31 ENCOUNTER — Encounter: Payer: Self-pay | Admitting: Osteopathic Medicine

## 2016-05-31 ENCOUNTER — Ambulatory Visit (INDEPENDENT_AMBULATORY_CARE_PROVIDER_SITE_OTHER): Payer: BLUE CROSS/BLUE SHIELD | Admitting: Osteopathic Medicine

## 2016-05-31 VITALS — BP 132/92 | HR 96 | Ht 63.0 in | Wt 125.0 lb

## 2016-05-31 DIAGNOSIS — K1329 Other disturbances of oral epithelium, including tongue: Secondary | ICD-10-CM

## 2016-05-31 DIAGNOSIS — F411 Generalized anxiety disorder: Secondary | ICD-10-CM | POA: Diagnosis not present

## 2016-05-31 MED ORDER — CHLORHEXIDINE GLUCONATE 0.12 % MT SOLN: 10.0000 mL | Freq: Two times a day (BID) | OROMUCOSAL | Status: DC

## 2016-06-15 ENCOUNTER — Emergency Department
Admission: EM | Admit: 2016-06-15 | Discharge: 2016-06-15 | Disposition: A | Discharge status: Home / Self Care | Payer: BLUE CROSS/BLUE SHIELD | Source: Home / Self Care | Attending: Emergency Medicine | Admitting: Emergency Medicine

## 2016-06-15 ENCOUNTER — Encounter: Payer: Self-pay | Admitting: Emergency Medicine

## 2016-06-15 DIAGNOSIS — G43101 Migraine with aura, not intractable, with status migrainosus: Secondary | ICD-10-CM

## 2016-06-15 MED ORDER — SUMATRIPTAN SUCCINATE 100 MG PO TABS: ORAL_TABLET | ORAL | Status: DC

## 2016-06-15 MED ORDER — ONDANSETRON 4 MG PO TBDP: 4.0000 mg | ORAL_TABLET | Freq: Three times a day (TID) | ORAL | Status: DC | PRN

## 2016-06-15 MED ORDER — ONDANSETRON 4 MG PO TBDP
4.0000 mg | ORAL_TABLET | Freq: Once | ORAL | Status: AC
  Administered 2016-06-15: 4 mg via ORAL

## 2016-06-15 MED ORDER — KETOROLAC TROMETHAMINE 60 MG/2ML IM SOLN
60.0000 mg | Freq: Once | INTRAMUSCULAR | Status: AC
  Administered 2016-06-15: 60 mg via INTRAMUSCULAR

## 2016-06-15 NOTE — ED Notes (Signed)
Migraine, nausea, loss of appetite, slight diarrhea, chills, congestion (resolved) x 6 days

## 2016-06-21 ENCOUNTER — Encounter: Payer: Self-pay | Admitting: Osteopathic Medicine

## 2016-06-21 ENCOUNTER — Ambulatory Visit (INDEPENDENT_AMBULATORY_CARE_PROVIDER_SITE_OTHER): Payer: BLUE CROSS/BLUE SHIELD | Admitting: Osteopathic Medicine

## 2016-06-21 VITALS — BP 131/79 | HR 89 | Wt 122.0 lb

## 2016-06-21 DIAGNOSIS — F1320 Sedative, hypnotic or anxiolytic dependence, uncomplicated: Secondary | ICD-10-CM | POA: Diagnosis not present

## 2016-06-21 DIAGNOSIS — F411 Generalized anxiety disorder: Secondary | ICD-10-CM

## 2016-06-21 DIAGNOSIS — Z8669 Personal history of other diseases of the nervous system and sense organs: Secondary | ICD-10-CM

## 2016-06-21 NOTE — Progress Notes (Signed)
HPI: Shelly Cardenas is a 35 y.o. female who [facility] today for chief complaint of:  Chief Complaint  Patient presents with  . Referral    BEHAVIORAL MATTER    ANXIETY   Last visit, referred to behavioral health - The patient didn't hear for 2 weeks so she called them and they told her the referral was refused. To my knowledge our office was not informed of this refusal so we were unable to arrange alternate referral.  . Context: Hx anxiety and panic, sleeping problem. Last seen in this office 05/31/16 - Dr Jordan LikesPool (psychiatrist) writing Rx for Alprazolam and Lunesta.  . Modifying factors: Patient does not want to be on Xanax forever, is willing to try alernatives, but is concerned about running out and experiencing withdrawal. At last visit, mentioned that she should bring this up with her psychiatrist for continuation of this medication versus tapering off of it, I am happy to taper her off of the medicine that I will not continue prescribing the Xanax.  Referral downstairs to Dr Gilmore LarocheAkhtar was declined. See below for notes.   Due to run out of the Alprazolam on 07/07/2016, has 6 - 7 nights of Lunesta left.  I called Dr. Lindalou HosePool's office - Heidi confirmed patient had been dismissed due to no-show appointments. She told me that the Rx on 06/08/2016 was the last Rx written by Dr Dutch QuintPoole and was legitimate, there is no concern for patient using fraudulent prescriptions for Xanax or Lunesta.   [place] Controlled Substance Database reviewed at time of visit.  Medication(s) and Prescriber(s), Last Dispensed, # & Days Supply:  As of 05/31/2016: Xanax - Dr Jordan LikesPool, 05/09/16 #150 x 30 days As of 06/21/2016: Xanax - Dr. Jordan LikesPool, 06/08/16 #150 x 30 days  MIGRAINES Recently seen by UC and given Zofran and Imitrex. (+)N/V, some diarrhea, headache x7 days. Was given Toradol and Zofran. Was on Imitrex previously, using about 8 of these per month.    Referral Notes     Type Date  User   General 06/08/2016 2:47 PM LILLY, KRISTA L        Note   Per dr. Gilmore LarocheAkhtar. This patient was denied services due to medications.                        Type Date User   General 06/02/2016 10:44 AM LILLY, KRISTA L        Note   Will have akhtar review                       Type Date User   General 05/31/2016 4:42 PM Darlin DropFEASTER, CINDY A        Note   Sent referral through epic to [facility] they will call and schedule with patient for good appointment time. - CF                       Type Date User   Provider Comments 05/31/2016 4:29 PM Deirdre PippinsALEXANDER, Tahiri Shareef M        Summary   Provider Comments        Note   Patient on Xanax multiple doses per day - Dr Lyn HollingsheadAlexander will not continue long term prescription - has enough from previous Psychiatrist to last couple weeks       Past medical, social and family history reviewed: Past Medical History  Diagnosis Date  .  Depression   . Anxiety   . Chronic LBP   . Chronic pain syndrome 12/30/2015  . Generalized anxiety disorder 12/30/2015    Following with [facility] psychiatry, Dr Madelin Headings Pool. On Xanax, Lunesta. Records indicate patient should also be taking Zoloft however she did not report this on her intake our clinic, will defer to psychiatry   Past Surgical History  Procedure Laterality Date  . Tubal ligation     Social History  Substance Use Topics  . Smoking status: Current Every Day Smoker -- 1.00 packs/day for 25 years    Types: Cigarettes  . Smokeless tobacco: Not on file  . Alcohol Use: No   Family History  Problem Relation Age of Onset  . Hyperlipidemia Mother   . Hypertension Mother   . Cancer Father     Current Outpatient Prescriptions  Medication Sig Dispense Refill  . ALPRAZolam (XANAX) 0.5 MG tablet Take 0.5 mg by mouth 3 (three) times daily. PATIENT REPORTED: Taking 0.5 mg by mouth 3 (three) times daily and 1(one) mg at bedtime    . chlorhexidine  (PERIDEX) 0.12 % solution Use as directed 10 mLs in the mouth or throat 2 (two) times daily. 118 mL 3  . cyclobenzaprine (FLEXERIL) 10 MG tablet Take 10 mg by mouth every 6 (six) hours as needed.   5  . Eszopiclone 3 MG TABS TAKE 1 TABLET BY MOUTH AT BEDTIME AS NEEDED FOR SLEEP.  0  . HYDROcodone-acetaminophen (NORCO) 10-325 MG tablet Take 5 tablets by mouth every 6 (six) hours as needed.    . ondansetron (ZOFRAN-ODT) 4 MG disintegrating tablet Take 1 tablet (4 mg total) by mouth every 8 (eight) hours as needed for nausea or vomiting. 5 tablet 0  . SUMAtriptan (IMITREX) 100 MG tablet 1 tablet daily as needed for acute migraine headache. May repeat once in 2 hours if headache persists or recurs. 8 tablet 0   No current facility-administered medications for this visit.   No Known Allergies    Review of Systems: CONSTITUTIONAL:  No  fever, no chills HEAD/EYES/EARS/NOSE/THROAT: (+) History of migraine headache, no vision change, no hearing change, No sore throat, No  sinus pressure CARDIAC: No  chest pain RESPIRATORY: No  cough PSYCHIATRIC: (+) concerns with depression, (+) concerns with anxiety, (+) sleep problems  Exam:  BP 131/79 mmHg  Pulse 89  Wt 122 lb (55.339 kg) Constitutional: VS see above. General Appearance: alert, well-developed, well-nourished, NAD Psychiatric: Normal judgment/insight. Normal mood and affect. Oriented x3. No thought disorder. No SI/HI    ASSESSMENT/PLAN:   Generalized anxiety disorder - Refractory, request assistance from psychiatry with regard to management with alternatives to alprazolam  Benzodiazepine dependence (HCC) - I do not suspect the patient is abusing this medicine. Dependence a concern given multiple doses per day on previous Rx. Plan for Valium taper, psych referral.   History of migraine   All questions were answered. Visit summary with medication list and pertinent instructions was printed for patient to review. ER/RTC precautions were  reviewed with the patient. Return in about 2 weeks (around 07/07/2016), or sooner if needed, for DISCUSS DISCONTINUATION OF ANTI-ANXIETY MEDS .  Total time spent 25 minutes, greater than 50% of the visit was counseling and coordinating care for diagnosis of anxiety, benzo dependence.

## 2016-06-22 DIAGNOSIS — F1320 Sedative, hypnotic or anxiolytic dependence, uncomplicated: Secondary | ICD-10-CM | POA: Insufficient documentation

## 2016-06-27 DIAGNOSIS — M5416 Radiculopathy, lumbar region: Secondary | ICD-10-CM | POA: Diagnosis not present

## 2016-06-27 DIAGNOSIS — M79604 Pain in right leg: Secondary | ICD-10-CM | POA: Diagnosis not present

## 2016-06-27 DIAGNOSIS — Z79899 Other long term (current) drug therapy: Secondary | ICD-10-CM | POA: Diagnosis not present

## 2016-06-27 DIAGNOSIS — M5136 Other intervertebral disc degeneration, lumbar region: Secondary | ICD-10-CM | POA: Diagnosis not present

## 2016-06-27 DIAGNOSIS — M545 Low back pain: Secondary | ICD-10-CM | POA: Diagnosis not present

## 2016-06-27 DIAGNOSIS — G894 Chronic pain syndrome: Secondary | ICD-10-CM | POA: Diagnosis not present

## 2016-06-27 DIAGNOSIS — Z79891 Long term (current) use of opiate analgesic: Secondary | ICD-10-CM | POA: Diagnosis not present

## 2016-06-28 ENCOUNTER — Other Ambulatory Visit: Payer: Self-pay | Admitting: Osteopathic Medicine

## 2016-07-03 ENCOUNTER — Encounter: Payer: Self-pay | Admitting: Osteopathic Medicine

## 2016-07-03 ENCOUNTER — Ambulatory Visit: Payer: BLUE CROSS/BLUE SHIELD | Admitting: Osteopathic Medicine

## 2016-07-03 DIAGNOSIS — Z91199 Patient's noncompliance with other medical treatment and regimen due to unspecified reason: Secondary | ICD-10-CM

## 2016-07-04 ENCOUNTER — Encounter: Payer: Self-pay | Admitting: Osteopathic Medicine

## 2016-07-04 ENCOUNTER — Ambulatory Visit (INDEPENDENT_AMBULATORY_CARE_PROVIDER_SITE_OTHER): Payer: BLUE CROSS/BLUE SHIELD | Admitting: Osteopathic Medicine

## 2016-07-04 VITALS — BP 130/84 | HR 104 | Ht 63.0 in | Wt 125.0 lb

## 2016-07-04 DIAGNOSIS — F411 Generalized anxiety disorder: Secondary | ICD-10-CM

## 2016-07-04 MED ORDER — DIAZEPAM 5 MG PO TABS: ORAL_TABLET | ORAL | 0 refills | Status: DC

## 2016-07-04 MED ORDER — CITALOPRAM HYDROBROMIDE 40 MG PO TABS: 40.0000 mg | ORAL_TABLET | Freq: Every day | ORAL | 1 refills | Status: DC

## 2016-07-04 NOTE — Patient Instructions (Addendum)
Take the Valium as directed, planning to do a very slow taper at a fairly high dose. Will not go up on the Valium, will not restart the Xanax. We will work on seeing if we can get you seen downstairs, particularly since there was some poor communication involved in getting   set up down there initially, we'll do everything we can to get you seen ASAP either there or at the office in [place].  Plan to return to see Dr. Lyn Hollingshead in 4 weeks for maintenance of the Valium, come sooner if needed but be aware that we are not going to increase the dose of the Valium or restart the Xanax for any reason.   Please let us know if you have any further questions or if there is anything else we can do for you!

## 2016-07-04 NOTE — Progress Notes (Signed)
HPI: Shelly Cardenas is a 35 y.o. Not Hispanic or Latino female  who presents to [facility] today, 07/04/16,  for chief complaint of:  Chief Complaint  Patient presents with  . Follow-up    ANXIETY MEDICATION DISCUSSION     Patient previously seen for requesting psychology referral. Had been on 150 pills of Xanax per month from previous psychiatrist, admitted clear to the patient that I was not comfortable prescribing at this dose/frequency but was happy to help her taper off of benzodiazepine therapy/transition to longer acting formulation. Patient states that she was okay with this plan, she does not want to be on the Xanax forever. Was requesting psychiatry referral, however referral to behavioral health office downstairs resulted in rejection due to apparent misunderstanding that patient was requesting a physician who would fill 150 Xanax per month, however patient is more than willing to try alternative therapy but suffers from refractory anxiety. Patient has scheduled an appointment with in office in [place] however is not able to be seen until the fall.   Past medical, surgical, social and family history reviewed: Past Medical History:  Diagnosis Date  . Anxiety   . Chronic LBP   . Chronic pain syndrome 12/30/2015  . Depression   . Failure to attend appointment 07/03/2016   07/03/16 FOR ANXIETY MEDICATION MANAGEMENT - NO REFILLS  . Generalized anxiety disorder 12/30/2015   Following with [facility] psychiatry, Dr Madelin Headings Pool. On Xanax, Lunesta. Records indicate patient should also be taking Zoloft however she did not report this on her intake our clinic, will defer to psychiatry   Past Surgical History:  Procedure Laterality Date  . TUBAL LIGATION     Social History  Substance Use Topics  . Smoking status: Current Every Day Smoker    Packs/day: 1.00    Years: 25.00    Types: Cigarettes  . Smokeless tobacco: Not on file  . Alcohol use No    Family History  Problem Relation Age of Onset  . Hyperlipidemia Mother   . Hypertension Mother   . Cancer Father      Current medication list and allergy/intolerance information reviewed:   Current Outpatient Prescriptions  Medication Sig Dispense Refill  . ALPRAZolam (XANAX) 0.5 MG tablet Take 0.5 mg by mouth 3 (three) times daily. PATIENT REPORTED: Taking 0.5 mg by mouth 3 (three) times daily and 1(one) mg at bedtime    . chlorhexidine (PERIDEX) 0.12 % solution Use as directed 10 mLs in the mouth or throat 2 (two) times daily. 118 mL 3  . citalopram (CELEXA) 20 MG tablet Take 20 mg by mouth daily.    . cyclobenzaprine (FLEXERIL) 10 MG tablet Take 10 mg by mouth every 6 (six) hours as needed.   5  . Eszopiclone 3 MG TABS TAKE 1 TABLET AT BEDTIME AS NEEDED SLEEP 30 tablet 0  . HYDROcodone-acetaminophen (NORCO) 10-325 MG tablet Take 5 tablets by mouth every 6 (six) hours as needed.    . ondansetron (ZOFRAN-ODT) 4 MG disintegrating tablet Take 1 tablet (4 mg total) by mouth every 8 (eight) hours as needed for nausea or vomiting. 5 tablet 0  . SUMAtriptan (IMITREX) 100 MG tablet 1 tablet daily as needed for acute migraine headache. May repeat once in 2 hours if headache persists or recurs. 8 tablet 0   No current facility-administered medications for this visit.    No Known Allergies    Review of Systems:  Constitutional: No unintentional weight changes. No significant  fatigue.   HEENT: No  headache, no vision change  Cardiac: No  chest pain  Respiratory:  No  shortness of breath.   Psychiatric: No  concerns with depression, (+) concerns with anxiety, (+) sleep problems, No mood problems  Exam:  BP 130/84   Pulse (!) 104   Ht 5\' 3"  (1.6 m)   Wt 125 lb (56.7 kg)   BMI 22.14 kg/m   Constitutional: VS see above. General Appearance: alert, well-developed, well-nourished, NAD  Psychiatric: Normal judgment/insight. Normal mood and affect. Oriented x3. No SI/HI. No Thought  disorder     ASSESSMENT/PLAN: We'll see if relation to patient seen downstairs ASAP with behavioral health, I believe they're rejection of her initial referral was a misunderstanding, the patient is not seeking someone who will refill the Xanax at the level she was taking it before, she is looking for someone to help with refractory anxiety management and eventually get off of the Xanax. Today, I have initiated a slow Valium taper, she has been on Valium taper in the past and not done very well but thinks that it was too low a dose and not long enough, she is very concerned about the possibility of withdrawal. The patient information for full instructions, I will not increase the dose of the Valium, I will not restart the Xanax, these are not negotiable. Patient verbalizes understanding. Okay to continue the Lunesta that if psychiatry has any further recommendations regarding insomnia management, would welcome their expertise. Increased Celexa as well  Generalized anxiety disorder     Visit summary with medication list and pertinent instructions was printed for patient to review. All questions at time of visit were answered - patient instructed to contact office with any additional concerns. ER/RTC precautions were reviewed with the patient. Follow-up plan: Return in about 4 weeks (around 08/01/2016), or SOONER IF NEEDED, for VALIUM MANAGEMENT - UNLESS SEEN BY PSYCHIATRY SOONER.  Total time spent 25 minutes, greater than 50% of the visit was face-to-face counseling and coordinating care for diagnosis of GAD.

## 2016-07-05 NOTE — Progress Notes (Signed)
Shelly Cardenas  They were leaving the chart for Dr. Fredda Hammed to review again so I am waiting on them to let me know if he is willing to see her. - CF

## 2016-07-17 ENCOUNTER — Emergency Department
Admission: EM | Admit: 2016-07-17 | Discharge: 2016-07-17 | Disposition: A | Discharge status: Home / Self Care | Payer: BLUE CROSS/BLUE SHIELD | Source: Home / Self Care | Attending: Family Medicine | Admitting: Family Medicine

## 2016-07-17 ENCOUNTER — Encounter: Payer: Self-pay | Admitting: *Deleted

## 2016-07-17 DIAGNOSIS — G43109 Migraine with aura, not intractable, without status migrainosus: Secondary | ICD-10-CM

## 2016-07-17 MED ORDER — ONDANSETRON 4 MG PO TBDP
4.0000 mg | ORAL_TABLET | Freq: Once | ORAL | Status: AC
  Administered 2016-07-17: 4 mg via ORAL

## 2016-07-17 MED ORDER — DEXAMETHASONE SODIUM PHOSPHATE 10 MG/ML IJ SOLN
10.0000 mg | Freq: Once | INTRAMUSCULAR | Status: AC
  Administered 2016-07-17: 10 mg via INTRAMUSCULAR

## 2016-07-17 MED ORDER — KETOROLAC TROMETHAMINE 60 MG/2ML IM SOLN
60.0000 mg | Freq: Once | INTRAMUSCULAR | Status: AC
  Administered 2016-07-17: 60 mg via INTRAMUSCULAR

## 2016-07-17 NOTE — ED Provider Notes (Signed)
CSN: [id number]     Arrival date & time 07/17/16  1959 History   First MD Initiated Contact with Patient 07/17/16 2015     Chief Complaint  Patient presents with  . Migraine   (Consider location/radiation/quality/duration/timing/severity/associated sxs/prior Treatment) HPI  Shelly Cardenas is a 35 y.o. female presenting to UC with c/o generalized headache for the last 4 days, c/w prior migraines.  HA was gradual in onset, gradually worsening, 10/10 at this time. Pain does not radiate down her neck.  She has taken her Imitrex and ODT zofran w/o relief.  Pt did go to work today but HA has persisted.  Associated mild nausea w/o vomiting and photophobia.  She has not seen a headache specialist yet but is currently working with her PCP to help get her headaches under control.  [facility] with [facility], was given Toradol, which provided moderate relief at that time. Denies recent head injury.  Denies fever, chills, vomiting or change in vision or balance. No family hx of migraines.   Past Medical History:  Diagnosis Date  . Anxiety   . Chronic LBP   . Chronic pain syndrome 12/30/2015  . Depression   . Failure to attend appointment 07/03/2016   07/03/16 FOR ANXIETY MEDICATION MANAGEMENT - NO REFILLS  . Generalized anxiety disorder 12/30/2015   Following with [facility] psychiatry, Dr Madelin HeadingsMichael Lee Pool. On Xanax, Lunesta. Records indicate patient should also be taking Zoloft however she did not report this on her intake our clinic, will defer to psychiatry   Past Surgical History:  Procedure Laterality Date  . TUBAL LIGATION     Family History  Problem Relation Age of Onset  . Hyperlipidemia Mother   . Hypertension Mother   . Cancer Father    Social History  Substance Use Topics  . Smoking status: Current Every Day Smoker    Packs/day: 1.00    Years: 25.00    Types: Cigarettes  . Smokeless tobacco: Never Used  . Alcohol use No   OB History    No data  available     Review of Systems  Constitutional: Negative for chills and fatigue.  Eyes: Positive for photophobia. Negative for pain and visual disturbance.  Gastrointestinal: Positive for nausea. Negative for diarrhea and vomiting.  Musculoskeletal: Negative for arthralgias, myalgias, neck pain and neck stiffness.  Skin: Negative for rash.  Neurological: Positive for headaches. Negative for dizziness and light-headedness.    Allergies  Review of patient's allergies indicates no known allergies.  Home Medications   Prior to Admission medications   Medication Sig Start Date End Date Taking? Authorizing Provider  chlorhexidine (PERIDEX) 0.12 % solution Use as directed 10 mLs in the mouth or throat 2 (two) times daily. 05/31/16   Sunnie NielsenNatalie Alexander, DO  citalopram (CELEXA) 40 MG tablet Take 1 tablet (40 mg total) by mouth daily. 07/04/16   Sunnie NielsenNatalie Alexander, DO  cyclobenzaprine (FLEXERIL) 10 MG tablet Take 10 mg by mouth every 6 (six) hours as needed.  05/19/16   Historical Provider, MD  diazepam (VALIUM) 5 MG tablet Week 1-2: Take 1 tablet (5 mg total) by mouth 4 times daily (20 mg/day). Week 3-4: Take 1 tablet (5 mg total) by mouth 3 times daily (15 mg/day). End of Rx. Plan for Week 5: Take 1 tablet (5 mg total) by mouth 2 times daily (10 mg/day) and continue 07/04/16   Sunnie NielsenNatalie Alexander, DO  Eszopiclone 3 MG TABS TAKE 1 TABLET AT  BEDTIME AS NEEDED SLEEP 07/01/16   Sunnie Nielsen, DO  HYDROcodone-acetaminophen [facility]) 10-325 MG tablet Take 5 tablets by mouth every 6 (six) hours as needed.    Historical Provider, MD  ondansetron (ZOFRAN-ODT) 4 MG disintegrating tablet Take 1 tablet (4 mg total) by mouth every 8 (eight) hours as needed for nausea or vomiting. 06/15/16   Lajean Manes, MD  SUMAtriptan (IMITREX) 100 MG tablet 1 tablet daily as needed for acute migraine headache. May repeat once in 2 hours if headache persists or recurs. 06/15/16   Lajean Manes, MD   Meds Ordered and Administered this  Visit   Medications  ondansetron (ZOFRAN-ODT) disintegrating tablet 4 mg (4 mg Oral Given 07/17/16 2026)  dexamethasone (DECADRON) injection 10 mg (10 mg Intramuscular Given 07/17/16 2026)  ketorolac (TORADOL) injection 60 mg (60 mg Intramuscular Given 07/17/16 2026)    BP 128/85 (BP Location: Left Arm)   Pulse 94   Temp 97.8 F (36.6 C) (Oral)   Wt 116 lb (52.6 kg)   LMP 07/17/2016   SpO2 99%   BMI 20.55 kg/m  No data found.   Physical Exam  Constitutional: She is oriented to person, place, and time. She appears well-developed and well-nourished. No distress.  Pt sitting in darkened room with hands covering her eyes, appears uncomfortable but is alert.   HENT:  Head: Normocephalic and atraumatic.  Right Ear: Tympanic membrane normal.  Left Ear: Tympanic membrane normal.  Nose: Nose normal.  Mouth/Throat: Uvula is midline, oropharynx is clear and moist and mucous membranes are normal.  Eyes: Conjunctivae and EOM are normal. Pupils are equal, round, and reactive to light. Right eye exhibits no discharge. Left eye exhibits no discharge.  Neck: Normal range of motion. Neck supple.  Cardiovascular: Normal rate and regular rhythm.   Pulmonary/Chest: Effort normal and breath sounds normal. No respiratory distress. She has no wheezes. She has no rales.  Musculoskeletal: Normal range of motion.  Neurological: She is alert and oriented to person, place, and time.  Skin: Skin is warm and dry. She is not diaphoretic.  Psychiatric: She has a normal mood and affect. Her behavior is normal.  Nursing note and vitals reviewed.   Urgent Care Course   Clinical Course    Procedures (including critical care time)  Labs Review Labs Reviewed - No data to display  Imaging Review No results found.   MDM   1. Migraine with aura and without status migrainosus, not intractable    Pt c/o gradually worsening HA c/w prior migraines. Normal neuro exam.  Tx in UC: Toradol  IM, Decadron   and zofran .  Pt notes HA improved from 10/10 to 4/10, she feels comfortable being discharged home. Encouraged f/u with PCP for ongoing care of her migraine.     Junius Finner, PA-C 07/18/16 [id number]

## 2016-07-26 DIAGNOSIS — G894 Chronic pain syndrome: Secondary | ICD-10-CM | POA: Diagnosis not present

## 2016-07-26 DIAGNOSIS — M5136 Other intervertebral disc degeneration, lumbar region: Secondary | ICD-10-CM | POA: Diagnosis not present

## 2016-07-26 DIAGNOSIS — Z79899 Other long term (current) drug therapy: Secondary | ICD-10-CM | POA: Diagnosis not present

## 2016-07-26 DIAGNOSIS — M545 Low back pain: Secondary | ICD-10-CM | POA: Diagnosis not present

## 2016-07-26 DIAGNOSIS — M5416 Radiculopathy, lumbar region: Secondary | ICD-10-CM | POA: Diagnosis not present

## 2016-07-26 DIAGNOSIS — Z79891 Long term (current) use of opiate analgesic: Secondary | ICD-10-CM | POA: Diagnosis not present

## 2016-08-03 ENCOUNTER — Other Ambulatory Visit: Payer: Self-pay | Admitting: Osteopathic Medicine

## 2016-08-08 ENCOUNTER — Other Ambulatory Visit: Payer: Self-pay | Admitting: Osteopathic Medicine

## 2016-08-08 ENCOUNTER — Encounter: Payer: Self-pay | Admitting: Osteopathic Medicine

## 2016-08-08 ENCOUNTER — Ambulatory Visit (INDEPENDENT_AMBULATORY_CARE_PROVIDER_SITE_OTHER): Payer: BLUE CROSS/BLUE SHIELD | Admitting: Osteopathic Medicine

## 2016-08-08 VITALS — BP 120/76 | HR 128 | Ht 63.0 in | Wt 122.0 lb

## 2016-08-08 DIAGNOSIS — F1320 Sedative, hypnotic or anxiolytic dependence, uncomplicated: Secondary | ICD-10-CM | POA: Diagnosis not present

## 2016-08-08 DIAGNOSIS — F411 Generalized anxiety disorder: Secondary | ICD-10-CM | POA: Diagnosis not present

## 2016-08-08 MED ORDER — DIAZEPAM 5 MG PO TABS: ORAL_TABLET | ORAL | 0 refills | Status: AC

## 2016-08-08 MED ORDER — DIAZEPAM 5 MG PO TABS: ORAL_TABLET | ORAL | 0 refills | Status: DC

## 2016-08-08 NOTE — Progress Notes (Signed)
HPI: Shelly Cardenas is a 35 y.o. Not Hispanic or Latino female  who presents to [facility] [place] today, 08/08/16,  for chief complaint of:  Chief Complaint  Patient presents with  . Follow-up    MEDICATION MANAGEMENT     Patient previously on 150 pills of Xanax per month from previous psychiatrist, at previous visits I made it clear to the patient that I was not comfortable prescribing at this dose/frequency but was happy to help her taper off of benzodiazepine therapy/transition to longer acting formulation. Patient states that she was okay with this plan, she does not want to be on the Xanax forever.  She was requesting psychiatry referral, however referral to behavioral health office downstairs resulted in rejection due to apparent misunderstanding that patient was requesting a physician who would fill 150 Xanax per month, however patient is more than willing to try alternative therapy but suffers from refractory anxiety. Patient has scheduled an appointment with in [place] in [place] however is not able to be seen for several weeks.   She is here to follow up today after initiation of Valium taper, patient was given instructions to take 5 mg tablets 4 times a day for weeks 1-2 and 3 times a day for weeks 3-4 and follow-up here. She was given a 30 day supply, should have run out 08/04/2016. Patient states she did run out of bed earlier than not, the regimen as noted above was not controlling her symptoms so she did at half a tablet on Sundays. She did not contact me regarding altering dose of medication. She states she has had 3 severe panic attacks and she has been out of the medication.   Past medical, surgical, social and family history reviewed: Past Medical History:  Diagnosis Date  . Anxiety   . Chronic LBP   . Chronic pain syndrome 12/30/2015  . Depression   . Failure to attend appointment 07/03/2016   07/03/16 FOR ANXIETY MEDICATION MANAGEMENT - NO  REFILLS  . Generalized anxiety disorder 12/30/2015   Following with [facility] psychiatry, Dr Madelin HeadingsMichael Lee Pool. On Xanax, Lunesta. Records indicate patient should also be taking Zoloft however she did not report this on her intake our clinic, will defer to psychiatry   Past Surgical History:  Procedure Laterality Date  . TUBAL LIGATION     Social History  Substance Use Topics  . Smoking status: Current Every Day Smoker    Packs/day: 1.00    Years: 25.00    Types: Cigarettes  . Smokeless tobacco: Never Used  . Alcohol use No   Family History  Problem Relation Age of Onset  . Hyperlipidemia Mother   . Hypertension Mother   . Cancer Father      Current medication list and allergy/intolerance information reviewed:   Current Outpatient Prescriptions  Medication Sig Dispense Refill  . chlorhexidine (PERIDEX) 0.12 % solution Use as directed 10 mLs in the mouth or throat 2 (two) times daily. 118 mL 3  . citalopram (CELEXA) 40 MG tablet Take 1 tablet (40 mg total) by mouth daily. 30 tablet 1  . cyclobenzaprine (FLEXERIL) 10 MG tablet Take 10 mg by mouth every 6 (six) hours as needed.   5  . diazepam (VALIUM) 5 MG tablet Week 1-2: Take 1 tablet (5 mg total) by mouth 4 times daily (20 mg/day). Week 3-4: Take 1 tablet (5 mg total) by mouth 3 times daily (15 mg/day). End of Rx. Plan for Week 5: Take 1 tablet (  5 mg total) by mouth 2 times daily (10 mg/day) and continue 98 tablet 0  . Eszopiclone 3 MG TABS TAKE 1 TABLET AT BEDTIME AS NEEDED SLEEP 30 tablet 0  . HYDROcodone-acetaminophen (NORCO) 10-325 MG tablet Take 5 tablets by mouth every 6 (six) hours as needed.    . ondansetron (ZOFRAN-ODT) 4 MG disintegrating tablet Take 1 tablet (4 mg total) by mouth every 8 (eight) hours as needed for nausea or vomiting. 5 tablet 0  . SUMAtriptan (IMITREX) 100 MG tablet 1 tablet daily as needed for acute migraine headache. May repeat once in 2 hours if headache persists or recurs. 8 tablet 0   No current  facility-administered medications for this visit.    No Known Allergies    Review of Systems:  Constitutional: No unintentional weight changes. No significant fatigue.   HEENT: No  headache, no vision change  Cardiac: No  chest pain  Respiratory:  No  shortness of breath.   Psychiatric: No  concerns with depression, (+) concerns with anxiety, (+) sleep problems, No mood problems  Exam:  BP 120/76   Pulse (!) 128   Ht 5\' 3"  (1.6 m)   Wt 122 lb (55.3 kg)   LMP 07/17/2016   BMI 21.61 kg/m   Constitutional: VS see above. General Appearance: alert, well-developed, well-nourished, NAD  CV: Tachycardic to auscultation HR 100-110, reg rhythm  Psychiatric: Normal judgment/insight. Significantly anxious mood and affect. Oriented x3. No SI/HI. No Thought disorder    ASSESSMENT/PLAN:   Last visit, I initiated a slow Valium taper, she has been on Valium taper in the past and not done very well but thinks that it was too low a dose and not long enough, she is very concerned about the possibility of withdrawal.  I advised patient that it is not acceptable to be altering the dose of the medication without first discussing with me. Since, however, she is on a long-acting formulation I am of course concerned about withdrawal which can take longer to develop on drugs with longer half-life. I do not think it is safe to discontinue the medications altogether, but I feel comfortable sticking with the tid dose. ER precautions reviewed in detail, patient does not want to go to the hospital most grossly necessary. Tachycardia is concerning the patient is also quite anxious at the moment, tearful on exam.  Had my nurse call down to behavioral health to see if patient could be accepted on short notice, given the initial misunderstanding regarding the first rejection of her referral. They initially told us that she could get in in 2 days however than they told her she would need to reschedule.  Understandable frustration with office downstairs, patient says she will go talk to them after this appointment but may decide to stick with her currently scheduled plan of seeing Dr. Lafayette Dragon on the 18th  Patient was sent to lab for collection of urine drug screen, she came back up to our office and was presented with prescription for 1 week supply of tid Valium and instructions to go downstairs to behavioral health to see about scheduling a visit  Okay to continue the [facility] and if psychiatry has any further recommendations regarding insomnia management, would welcome their expertise. Increased Celexa as well last visit.   Has appt 08/28/16 with Dr Marnee Guarneri in [place].   Generalized anxiety disorder - Plan: Drug Screen, Urine, Benzodiazepines Confirm, Urine, diazepam (VALIUM) 5 MG tablet, DISCONTINUED: diazepam (VALIUM) 5 MG tablet  Benzodiazepine dependence (HCC)  Visit summary with medication list and pertinent instructions was printed for patient to review. All questions at time of visit were answered - patient instructed to contact office with any additional concerns. ER/RTC precautions were reviewed with the patient. Follow-up plan: Return in about 1 week (around 08/15/2016) for MEDICATION MANAGEMENT FOLLOW-UP - UNLESS SEEN SOONER BY PSYCHIATRY.  Total time spent 25 minutes, greater than 50% of the visit was face-to-face counseling and coordinating care for diagnosis of GAD.

## 2016-08-09 ENCOUNTER — Telehealth: Payer: Self-pay | Admitting: Osteopathic Medicine

## 2016-08-10 ENCOUNTER — Ambulatory Visit (HOSPITAL_COMMUNITY): Payer: BLUE CROSS/BLUE SHIELD | Admitting: Psychiatry

## 2016-08-12 LAB — PAIN MGMT, PROFILE 6 CONF W/O MM, U
6 Acetylmorphine: NEGATIVE ng/mL (ref <10)
Alcohol Metabolites: POSITIVE ng/mL — AB (ref <500)
Alphahydroxyalprazolam: 1280 ng/mL — ABNORMAL HIGH (ref <25)
Alphahydroxymidazolam: NEGATIVE ng/mL (ref <50)
Alphahydroxytriazolam: NEGATIVE ng/mL (ref <50)
Aminoclonazepam: NEGATIVE ng/mL (ref <25)
Amobarbital: NEGATIVE ng/mL (ref <100)
Amphetamines: NEGATIVE ng/mL (ref <500)
Barbiturates: POSITIVE ng/mL — AB (ref <300)
Benzodiazepines: POSITIVE ng/mL — AB (ref <100)
Butalbital: 707 ng/mL — ABNORMAL HIGH (ref <100)
Cocaine Metabolite: NEGATIVE ng/mL (ref <150)
Codeine: NEGATIVE ng/mL (ref <50)
Creatinine: 193.1 mg/dL (ref >=20.0)
Ethyl Glucuronide (ETG): 638 ng/mL — ABNORMAL HIGH (ref <500)
Ethyl Sulfate (ETS): 274 ng/mL — ABNORMAL HIGH (ref <100)
Hydrocodone: 12943 ng/mL — ABNORMAL HIGH (ref <50)
Hydromorphone: 1559 ng/mL — ABNORMAL HIGH (ref <50)
Hydroxyethylflurazepam: NEGATIVE ng/mL (ref <50)
Lorazepam: NEGATIVE ng/mL (ref <50)
Marijuana Metabolite: NEGATIVE ng/mL (ref <20)
Methadone Metabolite: NEGATIVE ng/mL (ref <100)
Morphine: NEGATIVE ng/mL (ref <50)
Nordiazepam: NEGATIVE ng/mL (ref <50)
Norhydrocodone: >10000 ng/mL — ABNORMAL HIGH (ref <50)
Opiates: POSITIVE ng/mL — AB (ref <100)
Oxazepam: 65 ng/mL — ABNORMAL HIGH (ref <50)
Oxidant: NEGATIVE ug/mL (ref <200)
Oxycodone: NEGATIVE ng/mL (ref <100)
Pentobarbital: NEGATIVE ng/mL (ref <100)
Phencyclidine: NEGATIVE ng/mL (ref <25)
Phenobarbital: NEGATIVE ng/mL (ref <100)
Please note:: 0
Secobarbital: NEGATIVE ng/mL (ref <100)
Temazepam: NEGATIVE ng/mL (ref <50)
pH: 6.9 (ref 4.5–9.0)

## 2016-08-15 ENCOUNTER — Ambulatory Visit: Payer: BLUE CROSS/BLUE SHIELD | Admitting: Osteopathic Medicine

## 2016-08-17 ENCOUNTER — Ambulatory Visit (INDEPENDENT_AMBULATORY_CARE_PROVIDER_SITE_OTHER): Payer: BLUE CROSS/BLUE SHIELD | Admitting: Medical

## 2016-08-17 ENCOUNTER — Encounter (HOSPITAL_COMMUNITY): Payer: Self-pay | Admitting: Medical

## 2016-08-17 VITALS — BP 122/80 | HR 128 | Ht 63.0 in | Wt 117.0 lb

## 2016-08-17 DIAGNOSIS — F4310 Post-traumatic stress disorder, unspecified: Secondary | ICD-10-CM | POA: Diagnosis not present

## 2016-08-17 DIAGNOSIS — Z6372 Alcoholism and drug addiction in family: Secondary | ICD-10-CM

## 2016-08-17 DIAGNOSIS — F1120 Opioid dependence, uncomplicated: Secondary | ICD-10-CM

## 2016-08-17 DIAGNOSIS — F1320 Sedative, hypnotic or anxiolytic dependence, uncomplicated: Secondary | ICD-10-CM

## 2016-08-17 DIAGNOSIS — F411 Generalized anxiety disorder: Secondary | ICD-10-CM | POA: Diagnosis not present

## 2016-08-17 DIAGNOSIS — G894 Chronic pain syndrome: Secondary | ICD-10-CM

## 2016-08-17 MED ORDER — QUETIAPINE FUMARATE 25 MG PO TABS: 25.0000 mg | ORAL_TABLET | Freq: Every evening | ORAL | 2 refills | Status: DC | PRN

## 2016-08-17 MED ORDER — LAMOTRIGINE 25 MG PO TABS: ORAL_TABLET | ORAL | 0 refills | Status: DC

## 2016-08-17 MED ORDER — ALPRAZOLAM 0.5 MG PO TABS: 0.2500 mg | ORAL_TABLET | Freq: Three times a day (TID) | ORAL | 0 refills | Status: DC
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
## 2016-08-23 ENCOUNTER — Encounter: Payer: Self-pay | Admitting: Psychiatry

## 2016-08-25 DIAGNOSIS — M545 Low back pain: Secondary | ICD-10-CM | POA: Diagnosis not present

## 2016-08-25 DIAGNOSIS — Z79891 Long term (current) use of opiate analgesic: Secondary | ICD-10-CM | POA: Diagnosis not present

## 2016-08-25 DIAGNOSIS — Z79899 Other long term (current) drug therapy: Secondary | ICD-10-CM | POA: Diagnosis not present

## 2016-08-25 DIAGNOSIS — G894 Chronic pain syndrome: Secondary | ICD-10-CM | POA: Diagnosis not present

## 2016-08-25 DIAGNOSIS — M5416 Radiculopathy, lumbar region: Secondary | ICD-10-CM | POA: Diagnosis not present

## 2016-08-25 DIAGNOSIS — M5136 Other intervertebral disc degeneration, lumbar region: Secondary | ICD-10-CM | POA: Diagnosis not present

## 2016-08-29 DIAGNOSIS — F410 Panic disorder [episodic paroxysmal anxiety] without agoraphobia: Secondary | ICD-10-CM | POA: Diagnosis not present

## 2016-08-29 DIAGNOSIS — F3132 Bipolar disorder, current episode depressed, moderate: Secondary | ICD-10-CM | POA: Diagnosis not present

## 2016-08-30 ENCOUNTER — Other Ambulatory Visit: Payer: Self-pay | Admitting: Osteopathic Medicine

## 2016-09-13 ENCOUNTER — Other Ambulatory Visit: Payer: Self-pay | Admitting: Osteopathic Medicine

## 2016-09-13 MED ORDER — QUETIAPINE FUMARATE ER 150 MG PO TB24: 150.0000 mg | ORAL_TABLET | Freq: Every day | ORAL | 0 refills | Status: AC

## 2016-09-13 MED ORDER — LAMOTRIGINE 25 MG PO TABS: 25.0000 mg | ORAL_TABLET | Freq: Three times a day (TID) | ORAL | 0 refills | Status: DC

## 2016-09-13 MED ORDER — ALPRAZOLAM 0.5 MG PO TABS: 0.5000 mg | ORAL_TABLET | Freq: Every day | ORAL | 0 refills | Status: AC | PRN

## 2016-09-14 ENCOUNTER — Ambulatory Visit (HOSPITAL_COMMUNITY): Payer: Self-pay | Admitting: Medical

## 2016-09-18 ENCOUNTER — Encounter (HOSPITAL_COMMUNITY): Payer: Self-pay | Admitting: *Deleted

## 2016-09-22 DIAGNOSIS — Z79899 Other long term (current) drug therapy: Secondary | ICD-10-CM | POA: Diagnosis not present

## 2016-09-22 DIAGNOSIS — M5416 Radiculopathy, lumbar region: Secondary | ICD-10-CM | POA: Diagnosis not present

## 2016-09-22 DIAGNOSIS — M545 Low back pain: Secondary | ICD-10-CM | POA: Diagnosis not present

## 2016-09-22 DIAGNOSIS — Z79891 Long term (current) use of opiate analgesic: Secondary | ICD-10-CM | POA: Diagnosis not present

## 2016-09-22 DIAGNOSIS — G894 Chronic pain syndrome: Secondary | ICD-10-CM | POA: Diagnosis not present

## 2016-09-22 DIAGNOSIS — M5136 Other intervertebral disc degeneration, lumbar region: Secondary | ICD-10-CM | POA: Diagnosis not present

## 2016-10-09 DIAGNOSIS — F900 Attention-deficit hyperactivity disorder, predominantly inattentive type: Secondary | ICD-10-CM | POA: Diagnosis not present

## 2016-10-09 DIAGNOSIS — F410 Panic disorder [episodic paroxysmal anxiety] without agoraphobia: Secondary | ICD-10-CM | POA: Diagnosis not present

## 2016-10-09 DIAGNOSIS — F331 Major depressive disorder, recurrent, moderate: Secondary | ICD-10-CM | POA: Diagnosis not present

## 2016-10-25 ENCOUNTER — Other Ambulatory Visit: Payer: Self-pay

## 2016-10-25 DIAGNOSIS — N921 Excessive and frequent menstruation with irregular cycle: Secondary | ICD-10-CM | POA: Diagnosis not present

## 2016-10-26 MED ORDER — SUMATRIPTAN SUCCINATE 100 MG PO TABS: ORAL_TABLET | ORAL | 2 refills | Status: DC

## 2016-10-27 DIAGNOSIS — M79604 Pain in right leg: Secondary | ICD-10-CM | POA: Diagnosis not present

## 2016-10-27 DIAGNOSIS — G894 Chronic pain syndrome: Secondary | ICD-10-CM | POA: Diagnosis not present

## 2016-10-27 DIAGNOSIS — M5416 Radiculopathy, lumbar region: Secondary | ICD-10-CM | POA: Diagnosis not present

## 2016-10-27 DIAGNOSIS — M5136 Other intervertebral disc degeneration, lumbar region: Secondary | ICD-10-CM | POA: Diagnosis not present

## 2016-10-27 DIAGNOSIS — Z79899 Other long term (current) drug therapy: Secondary | ICD-10-CM | POA: Diagnosis not present

## 2016-10-27 DIAGNOSIS — Z79891 Long term (current) use of opiate analgesic: Secondary | ICD-10-CM | POA: Diagnosis not present

## 2016-11-07 ENCOUNTER — Emergency Department
Admission: EM | Admit: 2016-11-07 | Discharge: 2016-11-07 | Disposition: A | Discharge status: Home / Self Care | Payer: BLUE CROSS/BLUE SHIELD | Source: Home / Self Care | Attending: Family Medicine | Admitting: Family Medicine

## 2016-11-07 ENCOUNTER — Encounter: Payer: Self-pay | Admitting: *Deleted

## 2016-11-07 DIAGNOSIS — F310 Bipolar disorder, current episode hypomanic: Secondary | ICD-10-CM | POA: Diagnosis not present

## 2016-11-07 DIAGNOSIS — R51 Headache: Secondary | ICD-10-CM | POA: Diagnosis not present

## 2016-11-07 DIAGNOSIS — F900 Attention-deficit hyperactivity disorder, predominantly inattentive type: Secondary | ICD-10-CM | POA: Diagnosis not present

## 2016-11-07 DIAGNOSIS — R519 Headache, unspecified: Secondary | ICD-10-CM

## 2016-11-07 HISTORY — DX: Migraine, unspecified, not intractable, without status migrainosus: G43.909

## 2016-11-07 MED ORDER — KETOROLAC TROMETHAMINE 60 MG/2ML IM SOLN
60.0000 mg | Freq: Once | INTRAMUSCULAR | Status: AC
  Administered 2016-11-07: 60 mg via INTRAMUSCULAR

## 2016-11-07 MED ORDER — ONDANSETRON HCL 4 MG PO TABS: 4.0000 mg | ORAL_TABLET | Freq: Four times a day (QID) | ORAL | 0 refills | Status: DC

## 2016-11-07 MED ORDER — PREDNISONE 20 MG PO TABS: ORAL_TABLET | ORAL | 0 refills | Status: DC

## 2016-11-07 NOTE — ED Provider Notes (Signed)
CSN: [id number]     Arrival date & time 11/07/16  1502 History   First MD Initiated Contact with Patient 11/07/16 1528     Chief Complaint  Patient presents with  . Migraine   (Consider location/radiation/quality/duration/timing/severity/associated sxs/prior Treatment) HPI Shelly Cardenas is a 35 y.o. female presenting to UC with c/o headache behind her eyes and on the top of her head that started yesterday. Pain was gradual in onset, gradually worsening. Associated photophobia and nausea with one episode of vomiting yesterday. Pt notes it was a "15"/10 yesterday but has improved to a "12"/10 after taking 2 Imitrex and zofran yesterday and one Imitrex and one zofran today.  Pt has hx of migraines and has been f/u with her PCP for ongoing treatments and evaluation. She has been discussing possible imaging with her PCP, however, has had to put off further workup of her migraines due to "other health issues."  She has not f/u with a headache specialist yet.  She has tried Excedrin in the past for migraines, however, notes the medication only helps "normal" headaches. She notes she can tell the difference between a typical headache and a migraine.  Denies fever, chills, diarrhea, cough, or congestion.    Past Medical History:  Diagnosis Date  . Anxiety   . Chronic LBP   . Chronic pain syndrome 12/30/2015  . Depression   . Failure to attend appointment 07/03/2016   07/03/16 FOR ANXIETY MEDICATION MANAGEMENT - NO REFILLS  . Generalized anxiety disorder 12/30/2015   Following with [facility] psychiatry, Dr Madelin HeadingsMichael Lee Pool. On Xanax, Lunesta. Records indicate patient should also be taking Zoloft however she did not report this on her intake our clinic, will defer to psychiatry  . Migraine    Past Surgical History:  Procedure Laterality Date  . TUBAL LIGATION     Family History  Problem Relation Age of Onset  . Hyperlipidemia Mother   . Hypertension Mother   . Cancer Father    Social History   Substance Use Topics  . Smoking status: Current Every Day Smoker    Packs/day: 1.00    Years: 25.00    Types: Cigarettes  . Smokeless tobacco: Never Used  . Alcohol use 1.2 oz/week    2 Glasses of wine per week   OB History    No data available     Review of Systems  Constitutional: Negative for chills and fever.  HENT: Negative for congestion, ear pain, sinus pain and sinus pressure.   Eyes: Positive for photophobia and visual disturbance. Negative for pain.  Respiratory: Negative for cough and shortness of breath.   Cardiovascular: Negative for chest pain and palpitations.  Gastrointestinal: Positive for nausea and vomiting. Negative for abdominal pain and diarrhea.  Musculoskeletal: Negative for arthralgias, back pain, myalgias, neck pain and neck stiffness.  Skin: Negative for rash.  Neurological: Positive for headaches. Negative for dizziness, syncope, weakness, light-headedness and numbness.    Allergies  Patient has no known allergies.  Home Medications   Prior to Admission medications   Medication Sig Start Date End Date Taking? Authorizing Provider  ALPRAZolam Prudy Feeler(XANAX) 0.5 MG tablet Take 1 tablet (0.5 mg total) by mouth 5 (five) times daily as needed for anxiety. 09/13/16   Sunnie NielsenNatalie Alexander, DO  Ascorbic Acid (VITAMIN C) 500 MG CAPS Take by mouth.    Historical Provider, MD  chlorhexidine (PERIDEX) 0.12 % solution Use as directed 10 mLs in the mouth or throat 2 (two) times daily. 05/31/16   Dorene GrebeNatalie  Alexander, DO  citalopram (CELEXA) 40 MG tablet Take 1 tablet (40 mg total) by mouth daily. FOLLOW UP APPOINTMENT NEEDED FOR FURTHER REFILLS 08/30/16   Sunnie Nielsen, DO  cyclobenzaprine (FLEXERIL) 10 MG tablet Take 10 mg by mouth every 6 (six) hours as needed.  05/19/16   Historical Provider, MD  DASETTA 1/35 tablet  07/27/16   Historical Provider, MD  docusate sodium (COLACE) 100 MG capsule Take by mouth.    Historical Provider, MD  Eszopiclone 3 MG TABS Take by mouth.  05/31/16   Historical Provider, MD  gabapentin (NEURONTIN) 800 MG tablet  07/18/16   Historical Provider, MD  HYDROcodone-acetaminophen (NORCO) 10-325 MG tablet Take 5 tablets by mouth every 6 (six) hours as needed.    Historical Provider, MD  lamoTRIgine (LAMICTAL) 25 MG tablet Take 1 tablet (25 mg total) by mouth 3 (three) times daily. Until out of this medicine then starting Lamictal 100 mg daily 09/13/16   Sunnie Nielsen, DO  Multiple Vitamins-Minerals (THERA-M) TABS Take by mouth.    Historical Provider, MD  ondansetron (ZOFRAN) 4 MG tablet Take 1 tablet (4 mg total) by mouth every 6 (six) hours. 11/07/16   Junius Finner, PA-C  ondansetron (ZOFRAN-ODT) 4 MG disintegrating tablet Take 1 tablet (4 mg total) by mouth every 8 (eight) hours as needed for nausea or vomiting. 06/15/16   Lajean Manes, MD  predniSONE (DELTASONE) 20 MG tablet 3 tabs po day one, then 2 po daily x 4 days 11/07/16   Junius Finner, PA-C  QUEtiapine Fumarate (SEROQUEL XR) 150 MG 24 hr tablet Take 1 tablet (150 mg total) by mouth at bedtime. 09/13/16   Sunnie Nielsen, DO  SUMAtriptan (IMITREX) 100 MG tablet 1 tablet daily as needed for acute migraine headache. May repeat once in 2 hours if headache persists or recurs. 10/26/16   Sunnie Nielsen, DO  valACYclovir (VALTREX) 1000 MG tablet Take 2 pills q12 hours for one day. 03/31/15   Historical Provider, MD   Meds Ordered and Administered this Visit   Medications  ketorolac (TORADOL) injection 60 mg (60 mg Intramuscular Given 11/07/16 1546)    BP 117/79 (BP Location: Left Arm)   Pulse (!) 127   Temp 98.4 F (36.9 C) (Oral)   Wt 117 lb (53.1 kg)   LMP 11/05/2016   SpO2 98%   BMI 20.73 kg/m  No data found.   Physical Exam  Constitutional: She appears well-developed and well-nourished. No distress.  HENT:  Head: Normocephalic and atraumatic.  Right Ear: Tympanic membrane normal.  Left Ear: Tympanic membrane normal.  Nose: Nose normal.  Mouth/Throat: Uvula is  midline, oropharynx is clear and moist and mucous membranes are normal.  Eyes: Conjunctivae are normal. No scleral icterus.  Neck: Normal range of motion. Neck supple.  Cardiovascular: Regular rhythm and normal heart sounds.  Tachycardia present.   Pulmonary/Chest: Effort normal and breath sounds normal. No stridor. No respiratory distress. She has no wheezes. She has no rales.  Abdominal: Soft. She exhibits no distension. There is no tenderness.  Musculoskeletal: Normal range of motion.  Lymphadenopathy:    She has no cervical adenopathy.  Neurological: She is alert.  Skin: Skin is warm and dry. She is not diaphoretic.  Nursing note and vitals reviewed.   Urgent Care Course   Clinical Course     Procedures (including critical care time)  Labs Review Labs Reviewed - No data to display  Imaging Review No results found.   MDM   1. Bad headache  Pt c/o gradually worsening HA c/w prior migraines. No red flag symptoms.  Toradol 60mg  IM given in UC. HA improved from "12"/10 to 3/10. Pt feels comfortable being discharged home. She plans to rest this evening and go back to work tomorrow. Encouraged to f/u with a HA specialist or PCP for recurrent HAs. Rx: zofran, prednisone (may try if HA starting to worsen again) Patient verbalized understanding and agreement with treatment plan.     Junius Finnerrin O'Malley, PA-C 11/07/16 [phone]

## 2016-11-08 DIAGNOSIS — N921 Excessive and frequent menstruation with irregular cycle: Secondary | ICD-10-CM | POA: Diagnosis not present

## 2016-11-13 DIAGNOSIS — Z01818 Encounter for other preprocedural examination: Secondary | ICD-10-CM | POA: Diagnosis not present

## 2016-11-17 DIAGNOSIS — N83202 Unspecified ovarian cyst, left side: Secondary | ICD-10-CM | POA: Diagnosis not present

## 2016-11-17 DIAGNOSIS — M1990 Unspecified osteoarthritis, unspecified site: Secondary | ICD-10-CM | POA: Diagnosis not present

## 2016-11-17 DIAGNOSIS — K219 Gastro-esophageal reflux disease without esophagitis: Secondary | ICD-10-CM | POA: Diagnosis not present

## 2016-11-17 DIAGNOSIS — N924 Excessive bleeding in the premenopausal period: Secondary | ICD-10-CM | POA: Diagnosis not present

## 2016-11-17 DIAGNOSIS — M5136 Other intervertebral disc degeneration, lumbar region: Secondary | ICD-10-CM | POA: Diagnosis not present

## 2016-11-17 DIAGNOSIS — F329 Major depressive disorder, single episode, unspecified: Secondary | ICD-10-CM | POA: Diagnosis not present

## 2016-11-17 DIAGNOSIS — G894 Chronic pain syndrome: Secondary | ICD-10-CM | POA: Diagnosis not present

## 2016-11-17 DIAGNOSIS — N920 Excessive and frequent menstruation with regular cycle: Secondary | ICD-10-CM | POA: Diagnosis not present

## 2016-11-17 DIAGNOSIS — N858 Other specified noninflammatory disorders of uterus: Secondary | ICD-10-CM | POA: Diagnosis not present

## 2016-11-17 DIAGNOSIS — N83201 Unspecified ovarian cyst, right side: Secondary | ICD-10-CM | POA: Diagnosis not present

## 2016-11-17 DIAGNOSIS — F17210 Nicotine dependence, cigarettes, uncomplicated: Secondary | ICD-10-CM | POA: Diagnosis not present

## 2016-11-17 DIAGNOSIS — Z79899 Other long term (current) drug therapy: Secondary | ICD-10-CM | POA: Diagnosis not present

## 2016-11-17 DIAGNOSIS — N840 Polyp of corpus uteri: Secondary | ICD-10-CM | POA: Diagnosis not present

## 2016-11-30 DIAGNOSIS — M5136 Other intervertebral disc degeneration, lumbar region: Secondary | ICD-10-CM | POA: Diagnosis not present

## 2016-11-30 DIAGNOSIS — Z79899 Other long term (current) drug therapy: Secondary | ICD-10-CM | POA: Diagnosis not present

## 2016-11-30 DIAGNOSIS — M5416 Radiculopathy, lumbar region: Secondary | ICD-10-CM | POA: Diagnosis not present

## 2016-11-30 DIAGNOSIS — G894 Chronic pain syndrome: Secondary | ICD-10-CM | POA: Diagnosis not present

## 2016-11-30 DIAGNOSIS — Z79891 Long term (current) use of opiate analgesic: Secondary | ICD-10-CM | POA: Diagnosis not present

## 2016-12-06 ENCOUNTER — Telehealth: Payer: Self-pay

## 2016-12-06 MED ORDER — OSELTAMIVIR PHOSPHATE 75 MG PO CAPS: 75.0000 mg | ORAL_CAPSULE | Freq: Every day | ORAL | 0 refills | Status: DC

## 2016-12-07 DIAGNOSIS — Z681 Body mass index (BMI) 19 or less, adult: Secondary | ICD-10-CM | POA: Diagnosis not present

## 2016-12-07 DIAGNOSIS — Z124 Encounter for screening for malignant neoplasm of cervix: Secondary | ICD-10-CM | POA: Diagnosis not present

## 2016-12-07 DIAGNOSIS — Z01419 Encounter for gynecological examination (general) (routine) without abnormal findings: Secondary | ICD-10-CM | POA: Diagnosis not present

## 2016-12-26 DIAGNOSIS — M5136 Other intervertebral disc degeneration, lumbar region: Secondary | ICD-10-CM | POA: Diagnosis not present

## 2016-12-26 DIAGNOSIS — G894 Chronic pain syndrome: Secondary | ICD-10-CM | POA: Diagnosis not present

## 2016-12-26 DIAGNOSIS — M79604 Pain in right leg: Secondary | ICD-10-CM | POA: Diagnosis not present

## 2016-12-26 DIAGNOSIS — Z79899 Other long term (current) drug therapy: Secondary | ICD-10-CM | POA: Diagnosis not present

## 2016-12-26 DIAGNOSIS — M5416 Radiculopathy, lumbar region: Secondary | ICD-10-CM | POA: Diagnosis not present

## 2016-12-26 DIAGNOSIS — Z79891 Long term (current) use of opiate analgesic: Secondary | ICD-10-CM | POA: Diagnosis not present

## 2017-01-09 DIAGNOSIS — F900 Attention-deficit hyperactivity disorder, predominantly inattentive type: Secondary | ICD-10-CM | POA: Diagnosis not present

## 2017-01-09 DIAGNOSIS — F310 Bipolar disorder, current episode hypomanic: Secondary | ICD-10-CM | POA: Diagnosis not present

## 2017-01-22 ENCOUNTER — Telehealth: Payer: Self-pay | Admitting: Osteopathic Medicine

## 2017-01-23 DIAGNOSIS — M5136 Other intervertebral disc degeneration, lumbar region: Secondary | ICD-10-CM | POA: Diagnosis not present

## 2017-01-23 DIAGNOSIS — G894 Chronic pain syndrome: Secondary | ICD-10-CM | POA: Diagnosis not present

## 2017-01-23 DIAGNOSIS — M79604 Pain in right leg: Secondary | ICD-10-CM | POA: Diagnosis not present

## 2017-01-23 DIAGNOSIS — M5416 Radiculopathy, lumbar region: Secondary | ICD-10-CM | POA: Diagnosis not present

## 2017-01-23 DIAGNOSIS — Z79899 Other long term (current) drug therapy: Secondary | ICD-10-CM | POA: Diagnosis not present

## 2017-01-23 DIAGNOSIS — Z79891 Long term (current) use of opiate analgesic: Secondary | ICD-10-CM | POA: Diagnosis not present

## 2017-02-20 DIAGNOSIS — Z79891 Long term (current) use of opiate analgesic: Secondary | ICD-10-CM | POA: Diagnosis not present

## 2017-02-20 DIAGNOSIS — M5136 Other intervertebral disc degeneration, lumbar region: Secondary | ICD-10-CM | POA: Diagnosis not present

## 2017-02-20 DIAGNOSIS — Z79899 Other long term (current) drug therapy: Secondary | ICD-10-CM | POA: Diagnosis not present

## 2017-02-20 DIAGNOSIS — G894 Chronic pain syndrome: Secondary | ICD-10-CM | POA: Diagnosis not present

## 2017-02-20 DIAGNOSIS — M5416 Radiculopathy, lumbar region: Secondary | ICD-10-CM | POA: Diagnosis not present

## 2017-02-26 ENCOUNTER — Other Ambulatory Visit (HOSPITAL_COMMUNITY): Payer: Self-pay | Admitting: Medical

## 2017-03-19 DIAGNOSIS — M79604 Pain in right leg: Secondary | ICD-10-CM | POA: Diagnosis not present

## 2017-03-19 DIAGNOSIS — G894 Chronic pain syndrome: Secondary | ICD-10-CM | POA: Diagnosis not present

## 2017-03-19 DIAGNOSIS — M5416 Radiculopathy, lumbar region: Secondary | ICD-10-CM | POA: Diagnosis not present

## 2017-03-19 DIAGNOSIS — M5136 Other intervertebral disc degeneration, lumbar region: Secondary | ICD-10-CM | POA: Diagnosis not present

## 2017-04-11 DIAGNOSIS — M79604 Pain in right leg: Secondary | ICD-10-CM | POA: Diagnosis not present

## 2017-04-11 DIAGNOSIS — Z79899 Other long term (current) drug therapy: Secondary | ICD-10-CM | POA: Diagnosis not present

## 2017-04-11 DIAGNOSIS — M5416 Radiculopathy, lumbar region: Secondary | ICD-10-CM | POA: Diagnosis not present

## 2017-04-11 DIAGNOSIS — G894 Chronic pain syndrome: Secondary | ICD-10-CM | POA: Diagnosis not present

## 2017-04-11 DIAGNOSIS — M5136 Other intervertebral disc degeneration, lumbar region: Secondary | ICD-10-CM | POA: Diagnosis not present

## 2017-04-11 DIAGNOSIS — Z79891 Long term (current) use of opiate analgesic: Secondary | ICD-10-CM | POA: Diagnosis not present

## 2017-05-14 DIAGNOSIS — M79604 Pain in right leg: Secondary | ICD-10-CM | POA: Diagnosis not present

## 2017-05-14 DIAGNOSIS — M5416 Radiculopathy, lumbar region: Secondary | ICD-10-CM | POA: Diagnosis not present

## 2017-05-14 DIAGNOSIS — M5136 Other intervertebral disc degeneration, lumbar region: Secondary | ICD-10-CM | POA: Diagnosis not present

## 2017-05-14 DIAGNOSIS — G894 Chronic pain syndrome: Secondary | ICD-10-CM | POA: Diagnosis not present

## 2017-05-19 ENCOUNTER — Encounter: Payer: Self-pay | Admitting: Emergency Medicine

## 2017-05-19 ENCOUNTER — Emergency Department (INDEPENDENT_AMBULATORY_CARE_PROVIDER_SITE_OTHER): Payer: BLUE CROSS/BLUE SHIELD

## 2017-05-19 ENCOUNTER — Emergency Department
Admission: EM | Admit: 2017-05-19 | Discharge: 2017-05-19 | Disposition: A | Discharge status: Home / Self Care | Payer: BLUE CROSS/BLUE SHIELD | Source: Home / Self Care | Attending: Family Medicine | Admitting: Family Medicine

## 2017-05-19 DIAGNOSIS — M62838 Other muscle spasm: Secondary | ICD-10-CM

## 2017-05-19 DIAGNOSIS — M542 Cervicalgia: Secondary | ICD-10-CM | POA: Diagnosis not present

## 2017-05-19 MED ORDER — TIZANIDINE HCL 2 MG PO CAPS: 2.0000 mg | ORAL_CAPSULE | Freq: Three times a day (TID) | ORAL | 0 refills | Status: DC

## 2017-05-19 MED ORDER — HYDROCODONE-ACETAMINOPHEN 5-325 MG PO TABS: ORAL_TABLET | ORAL | 0 refills | Status: DC

## 2017-05-19 MED ORDER — PREDNISONE 20 MG PO TABS
ORAL_TABLET | ORAL | 0 refills | Status: DC
Start: 2017-05-19 — End: 2017-06-06

## 2017-05-19 MED ORDER — CERVICAL COLLAR ADJUSTABLE MISC: 0 refills | Status: DC

## 2017-05-25 ENCOUNTER — Ambulatory Visit (INDEPENDENT_AMBULATORY_CARE_PROVIDER_SITE_OTHER): Payer: BLUE CROSS/BLUE SHIELD | Admitting: Sports Medicine

## 2017-05-25 ENCOUNTER — Encounter: Payer: Self-pay | Admitting: Sports Medicine

## 2017-05-25 DIAGNOSIS — M542 Cervicalgia: Secondary | ICD-10-CM

## 2017-05-25 MED ORDER — MELOXICAM 15 MG PO TABS: ORAL_TABLET | ORAL | 3 refills | Status: DC

## 2017-05-25 MED ORDER — CYCLOBENZAPRINE HCL 10 MG PO TABS: ORAL_TABLET | ORAL | 0 refills | Status: DC

## 2017-05-25 MED ORDER — ACETAMINOPHEN ER 650 MG PO TBCR: 1300.0000 mg | EXTENDED_RELEASE_TABLET | Freq: Three times a day (TID) | ORAL | 3 refills | Status: DC | PRN

## 2017-05-25 MED ORDER — ACETAMINOPHEN-CODEINE #4 300-60 MG PO TABS: 1.0000 | ORAL_TABLET | Freq: Three times a day (TID) | ORAL | 0 refills | Status: DC | PRN

## 2017-05-25 MED ORDER — HYDROCODONE-ACETAMINOPHEN 10-325 MG PO TABS: 1.0000 | ORAL_TABLET | Freq: Three times a day (TID) | ORAL | 0 refills | Status: DC | PRN

## 2017-05-25 NOTE — Progress Notes (Signed)
   Subjective:    I'm seeing this patient as a consultation for:  Dr. Donna ChristenStephen Beese  CC: Neck pain  HPI: This is a pleasant 36 year old female has had pain that she localizes in the back of her neck with radiation occasionally over the shoulders and deltoids, for several years. More recently she's had severe pain with stiffness. She was seen in urgent care where x-rays showed reversal of the normal cervical lordosis consistent with spasm, she was placed on a prednisone taper, hydrocodone, Zanaflex. Unfortunately she's noted no improvements from this. No progressive weakness in the upper or lower extremities.  Past medical history:  Negative.  See flowsheet/record as well for more information.  Surgical history: Negative.  See flowsheet/record as well for more information.  Family history: Negative.  See flowsheet/record as well for more information.  Social history: Negative.  See flowsheet/record as well for more information.  Allergies, and medications have been entered into the medical record, reviewed, and no changes needed.   Review of Systems: No headache, visual changes, nausea, vomiting, diarrhea, constipation, dizziness, abdominal pain, skin rash, fevers, chills, night sweats, weight loss, swollen lymph nodes, body aches, joint swelling, muscle aches, chest pain, shortness of breath, mood changes, visual or auditory hallucinations.   Objective:   General: Well Developed, well nourished, and in no acute distress.  Neuro/Psych: Alert and oriented x3, extra-ocular muscles intact, able to move all 4 extremities, sensation grossly intact. Skin: Warm and dry, no rashes noted.  Respiratory: Not using accessory muscles, speaking in full sentences, trachea midline.  Cardiovascular: Pulses palpable, no extremity edema. Abdomen: Does not appear distended. Neck: Negative spurling's Range of motion limited by pain, tender to palpation over the trapezius and bilateral paracervical  muscles. Grip strength and sensation normal in bilateral hands Strength good C4 to T1 distribution No sensory change to C4 to T1 Reflexes normal  X-ray show reversal of the normal cervical lordosis.  Impression and Recommendations:   This case required medical decision making of moderate complexity.  Neck pain X-ray show reversal of the normal lordosis, symptoms consistent with cervical degenerative disc disease. Has failed greater than 6 weeks of physician directed conservative treatment. Flexeril, high-dose Tylenol, meloxicam, #10 hydrocodone. She will start her rehabilitation exercises. Also going to proceed with MRI.  I did review the [place] controlled substance database.

## 2017-05-28 ENCOUNTER — Telehealth: Payer: Self-pay | Admitting: Sports Medicine

## 2017-05-28 ENCOUNTER — Other Ambulatory Visit: Payer: Self-pay | Admitting: Sports Medicine

## 2017-05-28 DIAGNOSIS — M542 Cervicalgia: Secondary | ICD-10-CM

## 2017-05-28 MED ORDER — DIAZEPAM 5 MG PO TABS: ORAL_TABLET | ORAL | 0 refills | Status: DC

## 2017-05-28 MED ORDER — ACETAMINOPHEN-CODEINE #4 300-60 MG PO TABS: 1.0000 | ORAL_TABLET | Freq: Three times a day (TID) | ORAL | 0 refills | Status: DC | PRN

## 2017-05-31 ENCOUNTER — Telehealth: Payer: Self-pay | Admitting: *Deleted

## 2017-05-31 ENCOUNTER — Other Ambulatory Visit: Payer: Self-pay | Admitting: Sports Medicine

## 2017-05-31 DIAGNOSIS — M542 Cervicalgia: Secondary | ICD-10-CM

## 2017-05-31 MED ORDER — ACETAMINOPHEN-CODEINE #4 300-60 MG PO TABS: 1.0000 | ORAL_TABLET | Freq: Three times a day (TID) | ORAL | 0 refills | Status: DC | PRN

## 2017-06-01 ENCOUNTER — Other Ambulatory Visit: Payer: Self-pay | Admitting: Sports Medicine

## 2017-06-01 DIAGNOSIS — M542 Cervicalgia: Secondary | ICD-10-CM

## 2017-06-04 ENCOUNTER — Ambulatory Visit (INDEPENDENT_AMBULATORY_CARE_PROVIDER_SITE_OTHER): Payer: BLUE CROSS/BLUE SHIELD

## 2017-06-04 ENCOUNTER — Other Ambulatory Visit: Payer: Self-pay | Admitting: Sports Medicine

## 2017-06-04 DIAGNOSIS — M542 Cervicalgia: Secondary | ICD-10-CM | POA: Diagnosis not present

## 2017-06-04 DIAGNOSIS — M5011 Cervical disc disorder with radiculopathy,  high cervical region: Secondary | ICD-10-CM

## 2017-06-04 DIAGNOSIS — M5030 Other cervical disc degeneration, unspecified cervical region: Secondary | ICD-10-CM

## 2017-06-04 MED ORDER — MELOXICAM 15 MG PO TABS: ORAL_TABLET | ORAL | 3 refills | Status: DC

## 2017-06-04 MED ORDER — CYCLOBENZAPRINE HCL 10 MG PO TABS: ORAL_TABLET | ORAL | 3 refills | Status: DC

## 2017-06-05 ENCOUNTER — Other Ambulatory Visit: Payer: Self-pay | Admitting: Sports Medicine

## 2017-06-06 ENCOUNTER — Ambulatory Visit (INDEPENDENT_AMBULATORY_CARE_PROVIDER_SITE_OTHER): Payer: BLUE CROSS/BLUE SHIELD | Admitting: Sports Medicine

## 2017-06-06 DIAGNOSIS — M5030 Other cervical disc degeneration, unspecified cervical region: Secondary | ICD-10-CM

## 2017-06-07 DIAGNOSIS — Z79899 Other long term (current) drug therapy: Secondary | ICD-10-CM | POA: Diagnosis not present

## 2017-06-07 DIAGNOSIS — G894 Chronic pain syndrome: Secondary | ICD-10-CM | POA: Diagnosis not present

## 2017-06-07 DIAGNOSIS — Z79891 Long term (current) use of opiate analgesic: Secondary | ICD-10-CM | POA: Diagnosis not present

## 2017-06-07 DIAGNOSIS — M79604 Pain in right leg: Secondary | ICD-10-CM | POA: Diagnosis not present

## 2017-06-07 DIAGNOSIS — M5416 Radiculopathy, lumbar region: Secondary | ICD-10-CM | POA: Diagnosis not present

## 2017-06-07 DIAGNOSIS — M5136 Other intervertebral disc degeneration, lumbar region: Secondary | ICD-10-CM | POA: Diagnosis not present

## 2017-06-29 DIAGNOSIS — N281 Cyst of kidney, acquired: Secondary | ICD-10-CM | POA: Diagnosis not present

## 2017-06-29 DIAGNOSIS — K5900 Constipation, unspecified: Secondary | ICD-10-CM | POA: Diagnosis not present

## 2017-06-29 DIAGNOSIS — R5383 Other fatigue: Secondary | ICD-10-CM | POA: Diagnosis not present

## 2017-06-29 DIAGNOSIS — R140 Abdominal distension (gaseous): Secondary | ICD-10-CM | POA: Diagnosis not present

## 2017-06-29 DIAGNOSIS — F17210 Nicotine dependence, cigarettes, uncomplicated: Secondary | ICD-10-CM | POA: Diagnosis not present

## 2017-06-29 DIAGNOSIS — R1084 Generalized abdominal pain: Secondary | ICD-10-CM | POA: Diagnosis not present

## 2017-06-29 DIAGNOSIS — K219 Gastro-esophageal reflux disease without esophagitis: Secondary | ICD-10-CM | POA: Diagnosis not present

## 2017-06-29 DIAGNOSIS — N200 Calculus of kidney: Secondary | ICD-10-CM | POA: Diagnosis not present

## 2017-07-03 DIAGNOSIS — F310 Bipolar disorder, current episode hypomanic: Secondary | ICD-10-CM | POA: Diagnosis not present

## 2017-07-03 DIAGNOSIS — F900 Attention-deficit hyperactivity disorder, predominantly inattentive type: Secondary | ICD-10-CM | POA: Diagnosis not present

## 2017-07-05 ENCOUNTER — Ambulatory Visit: Payer: Self-pay | Admitting: Sports Medicine

## 2017-07-05 DIAGNOSIS — M5136 Other intervertebral disc degeneration, lumbar region: Secondary | ICD-10-CM | POA: Diagnosis not present

## 2017-07-05 DIAGNOSIS — M5416 Radiculopathy, lumbar region: Secondary | ICD-10-CM | POA: Diagnosis not present

## 2017-07-05 DIAGNOSIS — Z79891 Long term (current) use of opiate analgesic: Secondary | ICD-10-CM | POA: Diagnosis not present

## 2017-07-05 DIAGNOSIS — G894 Chronic pain syndrome: Secondary | ICD-10-CM | POA: Diagnosis not present

## 2017-07-05 DIAGNOSIS — M79604 Pain in right leg: Secondary | ICD-10-CM | POA: Diagnosis not present

## 2017-07-05 DIAGNOSIS — Z79899 Other long term (current) drug therapy: Secondary | ICD-10-CM | POA: Diagnosis not present

## 2017-08-02 ENCOUNTER — Other Ambulatory Visit: Payer: Self-pay | Admitting: Sports Medicine

## 2017-08-02 DIAGNOSIS — M542 Cervicalgia: Secondary | ICD-10-CM

## 2017-08-02 DIAGNOSIS — Z79891 Long term (current) use of opiate analgesic: Secondary | ICD-10-CM | POA: Diagnosis not present

## 2017-08-02 DIAGNOSIS — M79604 Pain in right leg: Secondary | ICD-10-CM | POA: Diagnosis not present

## 2017-08-02 DIAGNOSIS — Z79899 Other long term (current) drug therapy: Secondary | ICD-10-CM | POA: Diagnosis not present

## 2017-08-02 DIAGNOSIS — M5136 Other intervertebral disc degeneration, lumbar region: Secondary | ICD-10-CM | POA: Diagnosis not present

## 2017-08-02 DIAGNOSIS — G894 Chronic pain syndrome: Secondary | ICD-10-CM | POA: Diagnosis not present

## 2017-08-02 DIAGNOSIS — M5416 Radiculopathy, lumbar region: Secondary | ICD-10-CM | POA: Diagnosis not present

## 2017-08-03 ENCOUNTER — Encounter: Payer: Self-pay | Admitting: Osteopathic Medicine

## 2017-08-03 DIAGNOSIS — R52 Pain, unspecified: Secondary | ICD-10-CM | POA: Insufficient documentation

## 2017-08-31 DIAGNOSIS — M5136 Other intervertebral disc degeneration, lumbar region: Secondary | ICD-10-CM | POA: Diagnosis not present

## 2017-08-31 DIAGNOSIS — M5416 Radiculopathy, lumbar region: Secondary | ICD-10-CM | POA: Diagnosis not present

## 2017-08-31 DIAGNOSIS — Z79899 Other long term (current) drug therapy: Secondary | ICD-10-CM | POA: Diagnosis not present

## 2017-08-31 DIAGNOSIS — M5030 Other cervical disc degeneration, unspecified cervical region: Secondary | ICD-10-CM | POA: Diagnosis not present

## 2017-08-31 DIAGNOSIS — G894 Chronic pain syndrome: Secondary | ICD-10-CM | POA: Diagnosis not present

## 2017-08-31 DIAGNOSIS — Z79891 Long term (current) use of opiate analgesic: Secondary | ICD-10-CM | POA: Diagnosis not present

## 2017-09-05 DIAGNOSIS — M5030 Other cervical disc degeneration, unspecified cervical region: Secondary | ICD-10-CM | POA: Diagnosis not present

## 2017-09-05 DIAGNOSIS — M47812 Spondylosis without myelopathy or radiculopathy, cervical region: Secondary | ICD-10-CM | POA: Diagnosis not present

## 2017-09-05 DIAGNOSIS — M542 Cervicalgia: Secondary | ICD-10-CM | POA: Diagnosis not present

## 2017-09-07 DIAGNOSIS — M542 Cervicalgia: Secondary | ICD-10-CM | POA: Diagnosis not present

## 2017-09-07 DIAGNOSIS — M47812 Spondylosis without myelopathy or radiculopathy, cervical region: Secondary | ICD-10-CM | POA: Diagnosis not present

## 2017-09-07 DIAGNOSIS — M5030 Other cervical disc degeneration, unspecified cervical region: Secondary | ICD-10-CM | POA: Diagnosis not present

## 2017-09-12 ENCOUNTER — Other Ambulatory Visit: Payer: Self-pay | Admitting: Sports Medicine

## 2017-09-12 DIAGNOSIS — M542 Cervicalgia: Secondary | ICD-10-CM

## 2017-09-12 DIAGNOSIS — M5030 Other cervical disc degeneration, unspecified cervical region: Secondary | ICD-10-CM | POA: Diagnosis not present

## 2017-09-12 DIAGNOSIS — M47812 Spondylosis without myelopathy or radiculopathy, cervical region: Secondary | ICD-10-CM | POA: Diagnosis not present

## 2017-09-19 DIAGNOSIS — M542 Cervicalgia: Secondary | ICD-10-CM | POA: Diagnosis not present

## 2017-09-19 DIAGNOSIS — M5030 Other cervical disc degeneration, unspecified cervical region: Secondary | ICD-10-CM | POA: Diagnosis not present

## 2017-09-19 DIAGNOSIS — M47812 Spondylosis without myelopathy or radiculopathy, cervical region: Secondary | ICD-10-CM | POA: Diagnosis not present

## 2017-09-24 DIAGNOSIS — M5030 Other cervical disc degeneration, unspecified cervical region: Secondary | ICD-10-CM | POA: Diagnosis not present

## 2017-09-24 DIAGNOSIS — M47812 Spondylosis without myelopathy or radiculopathy, cervical region: Secondary | ICD-10-CM | POA: Diagnosis not present

## 2017-09-24 DIAGNOSIS — M542 Cervicalgia: Secondary | ICD-10-CM | POA: Diagnosis not present

## 2017-09-26 DIAGNOSIS — G894 Chronic pain syndrome: Secondary | ICD-10-CM | POA: Diagnosis not present

## 2017-09-26 DIAGNOSIS — M5416 Radiculopathy, lumbar region: Secondary | ICD-10-CM | POA: Diagnosis not present

## 2017-09-26 DIAGNOSIS — M542 Cervicalgia: Secondary | ICD-10-CM | POA: Diagnosis not present

## 2017-09-26 DIAGNOSIS — M5136 Other intervertebral disc degeneration, lumbar region: Secondary | ICD-10-CM | POA: Diagnosis not present

## 2017-10-01 DIAGNOSIS — M5030 Other cervical disc degeneration, unspecified cervical region: Secondary | ICD-10-CM | POA: Diagnosis not present

## 2017-10-01 DIAGNOSIS — M542 Cervicalgia: Secondary | ICD-10-CM | POA: Diagnosis not present

## 2017-10-01 DIAGNOSIS — M47812 Spondylosis without myelopathy or radiculopathy, cervical region: Secondary | ICD-10-CM | POA: Diagnosis not present

## 2017-10-05 DIAGNOSIS — M5030 Other cervical disc degeneration, unspecified cervical region: Secondary | ICD-10-CM | POA: Diagnosis not present

## 2017-10-05 DIAGNOSIS — M47812 Spondylosis without myelopathy or radiculopathy, cervical region: Secondary | ICD-10-CM | POA: Diagnosis not present

## 2017-10-05 DIAGNOSIS — M542 Cervicalgia: Secondary | ICD-10-CM | POA: Diagnosis not present

## 2017-10-12 DIAGNOSIS — M5030 Other cervical disc degeneration, unspecified cervical region: Secondary | ICD-10-CM | POA: Diagnosis not present

## 2017-10-12 DIAGNOSIS — M47812 Spondylosis without myelopathy or radiculopathy, cervical region: Secondary | ICD-10-CM | POA: Diagnosis not present

## 2017-10-12 DIAGNOSIS — M542 Cervicalgia: Secondary | ICD-10-CM | POA: Diagnosis not present

## 2017-10-21 ENCOUNTER — Other Ambulatory Visit: Payer: Self-pay | Admitting: Sports Medicine

## 2017-10-21 DIAGNOSIS — M542 Cervicalgia: Secondary | ICD-10-CM

## 2017-10-26 DIAGNOSIS — Z79899 Other long term (current) drug therapy: Secondary | ICD-10-CM | POA: Diagnosis not present

## 2017-10-26 DIAGNOSIS — M5416 Radiculopathy, lumbar region: Secondary | ICD-10-CM | POA: Diagnosis not present

## 2017-10-26 DIAGNOSIS — M79604 Pain in right leg: Secondary | ICD-10-CM | POA: Diagnosis not present

## 2017-10-26 DIAGNOSIS — M5136 Other intervertebral disc degeneration, lumbar region: Secondary | ICD-10-CM | POA: Diagnosis not present

## 2017-10-26 DIAGNOSIS — G894 Chronic pain syndrome: Secondary | ICD-10-CM | POA: Diagnosis not present

## 2017-10-26 DIAGNOSIS — Z79891 Long term (current) use of opiate analgesic: Secondary | ICD-10-CM | POA: Diagnosis not present

## 2017-10-31 ENCOUNTER — Other Ambulatory Visit: Payer: Self-pay | Admitting: Sports Medicine

## 2017-10-31 DIAGNOSIS — M542 Cervicalgia: Secondary | ICD-10-CM

## 2017-11-26 DIAGNOSIS — G894 Chronic pain syndrome: Secondary | ICD-10-CM | POA: Diagnosis not present

## 2017-11-26 DIAGNOSIS — M79604 Pain in right leg: Secondary | ICD-10-CM | POA: Diagnosis not present

## 2017-11-26 DIAGNOSIS — M5136 Other intervertebral disc degeneration, lumbar region: Secondary | ICD-10-CM | POA: Diagnosis not present

## 2017-11-26 DIAGNOSIS — M5416 Radiculopathy, lumbar region: Secondary | ICD-10-CM | POA: Diagnosis not present

## 2017-12-24 ENCOUNTER — Other Ambulatory Visit: Payer: Self-pay | Admitting: Sports Medicine

## 2017-12-24 DIAGNOSIS — M542 Cervicalgia: Secondary | ICD-10-CM

## 2017-12-25 DIAGNOSIS — F3181 Bipolar II disorder: Secondary | ICD-10-CM | POA: Diagnosis not present

## 2017-12-25 DIAGNOSIS — F410 Panic disorder [episodic paroxysmal anxiety] without agoraphobia: Secondary | ICD-10-CM | POA: Diagnosis not present

## 2017-12-26 DIAGNOSIS — Z79891 Long term (current) use of opiate analgesic: Secondary | ICD-10-CM | POA: Diagnosis not present

## 2017-12-26 DIAGNOSIS — M542 Cervicalgia: Secondary | ICD-10-CM | POA: Diagnosis not present

## 2017-12-26 DIAGNOSIS — Z79899 Other long term (current) drug therapy: Secondary | ICD-10-CM | POA: Diagnosis not present

## 2017-12-26 DIAGNOSIS — G894 Chronic pain syndrome: Secondary | ICD-10-CM | POA: Diagnosis not present

## 2017-12-26 DIAGNOSIS — M5416 Radiculopathy, lumbar region: Secondary | ICD-10-CM | POA: Diagnosis not present

## 2017-12-26 DIAGNOSIS — M5136 Other intervertebral disc degeneration, lumbar region: Secondary | ICD-10-CM | POA: Diagnosis not present

## 2018-01-25 DIAGNOSIS — Z79899 Other long term (current) drug therapy: Secondary | ICD-10-CM | POA: Diagnosis not present

## 2018-01-25 DIAGNOSIS — M542 Cervicalgia: Secondary | ICD-10-CM | POA: Diagnosis not present

## 2018-01-25 DIAGNOSIS — M5416 Radiculopathy, lumbar region: Secondary | ICD-10-CM | POA: Diagnosis not present

## 2018-01-25 DIAGNOSIS — Z79891 Long term (current) use of opiate analgesic: Secondary | ICD-10-CM | POA: Diagnosis not present

## 2018-01-25 DIAGNOSIS — G894 Chronic pain syndrome: Secondary | ICD-10-CM | POA: Diagnosis not present

## 2018-01-25 DIAGNOSIS — M5136 Other intervertebral disc degeneration, lumbar region: Secondary | ICD-10-CM | POA: Diagnosis not present

## 2018-02-08 ENCOUNTER — Other Ambulatory Visit: Payer: Self-pay | Admitting: Sports Medicine

## 2018-02-08 DIAGNOSIS — M542 Cervicalgia: Secondary | ICD-10-CM

## 2018-02-21 ENCOUNTER — Other Ambulatory Visit: Payer: Self-pay | Admitting: Sports Medicine

## 2018-02-21 DIAGNOSIS — M542 Cervicalgia: Secondary | ICD-10-CM

## 2018-02-22 DIAGNOSIS — M79604 Pain in right leg: Secondary | ICD-10-CM | POA: Diagnosis not present

## 2018-02-22 DIAGNOSIS — M542 Cervicalgia: Secondary | ICD-10-CM | POA: Diagnosis not present

## 2018-02-22 DIAGNOSIS — M5136 Other intervertebral disc degeneration, lumbar region: Secondary | ICD-10-CM | POA: Diagnosis not present

## 2018-02-22 DIAGNOSIS — G894 Chronic pain syndrome: Secondary | ICD-10-CM | POA: Diagnosis not present

## 2018-03-22 DIAGNOSIS — M5416 Radiculopathy, lumbar region: Secondary | ICD-10-CM | POA: Diagnosis not present

## 2018-03-22 DIAGNOSIS — Z79891 Long term (current) use of opiate analgesic: Secondary | ICD-10-CM | POA: Diagnosis not present

## 2018-03-22 DIAGNOSIS — M5136 Other intervertebral disc degeneration, lumbar region: Secondary | ICD-10-CM | POA: Diagnosis not present

## 2018-03-22 DIAGNOSIS — Z79899 Other long term (current) drug therapy: Secondary | ICD-10-CM | POA: Diagnosis not present

## 2018-03-22 DIAGNOSIS — M542 Cervicalgia: Secondary | ICD-10-CM | POA: Diagnosis not present

## 2018-03-22 DIAGNOSIS — G894 Chronic pain syndrome: Secondary | ICD-10-CM | POA: Diagnosis not present

## 2018-04-02 ENCOUNTER — Other Ambulatory Visit: Payer: Self-pay | Admitting: Sports Medicine

## 2018-04-02 DIAGNOSIS — M542 Cervicalgia: Secondary | ICD-10-CM

## 2018-04-22 DIAGNOSIS — Z79899 Other long term (current) drug therapy: Secondary | ICD-10-CM | POA: Diagnosis not present

## 2018-04-22 DIAGNOSIS — M79604 Pain in right leg: Secondary | ICD-10-CM | POA: Diagnosis not present

## 2018-04-22 DIAGNOSIS — Z79891 Long term (current) use of opiate analgesic: Secondary | ICD-10-CM | POA: Diagnosis not present

## 2018-04-22 DIAGNOSIS — M542 Cervicalgia: Secondary | ICD-10-CM | POA: Diagnosis not present

## 2018-04-22 DIAGNOSIS — M5136 Other intervertebral disc degeneration, lumbar region: Secondary | ICD-10-CM | POA: Diagnosis not present

## 2018-04-22 DIAGNOSIS — G894 Chronic pain syndrome: Secondary | ICD-10-CM | POA: Diagnosis not present

## 2018-05-20 DIAGNOSIS — M5136 Other intervertebral disc degeneration, lumbar region: Secondary | ICD-10-CM | POA: Diagnosis not present

## 2018-05-20 DIAGNOSIS — G894 Chronic pain syndrome: Secondary | ICD-10-CM | POA: Diagnosis not present

## 2018-05-20 DIAGNOSIS — Z79899 Other long term (current) drug therapy: Secondary | ICD-10-CM | POA: Diagnosis not present

## 2018-05-20 DIAGNOSIS — Z79891 Long term (current) use of opiate analgesic: Secondary | ICD-10-CM | POA: Diagnosis not present

## 2018-05-20 DIAGNOSIS — M5416 Radiculopathy, lumbar region: Secondary | ICD-10-CM | POA: Diagnosis not present

## 2018-05-26 ENCOUNTER — Other Ambulatory Visit: Payer: Self-pay | Admitting: Sports Medicine

## 2018-05-26 DIAGNOSIS — M542 Cervicalgia: Secondary | ICD-10-CM

## 2018-05-29 DIAGNOSIS — M5416 Radiculopathy, lumbar region: Secondary | ICD-10-CM | POA: Diagnosis not present

## 2018-05-29 DIAGNOSIS — Z79899 Other long term (current) drug therapy: Secondary | ICD-10-CM | POA: Diagnosis not present

## 2018-05-29 DIAGNOSIS — Z79891 Long term (current) use of opiate analgesic: Secondary | ICD-10-CM | POA: Diagnosis not present

## 2018-05-29 DIAGNOSIS — M79604 Pain in right leg: Secondary | ICD-10-CM | POA: Diagnosis not present

## 2018-05-29 DIAGNOSIS — M542 Cervicalgia: Secondary | ICD-10-CM | POA: Diagnosis not present

## 2018-05-29 DIAGNOSIS — G894 Chronic pain syndrome: Secondary | ICD-10-CM | POA: Diagnosis not present

## 2018-06-14 DIAGNOSIS — M545 Low back pain: Secondary | ICD-10-CM | POA: Diagnosis not present

## 2018-06-14 DIAGNOSIS — M5412 Radiculopathy, cervical region: Secondary | ICD-10-CM | POA: Diagnosis not present

## 2018-06-14 DIAGNOSIS — G894 Chronic pain syndrome: Secondary | ICD-10-CM | POA: Diagnosis not present

## 2018-06-14 DIAGNOSIS — M5030 Other cervical disc degeneration, unspecified cervical region: Secondary | ICD-10-CM | POA: Diagnosis not present

## 2018-06-14 DIAGNOSIS — Z79899 Other long term (current) drug therapy: Secondary | ICD-10-CM | POA: Diagnosis not present

## 2018-06-14 DIAGNOSIS — Z79891 Long term (current) use of opiate analgesic: Secondary | ICD-10-CM | POA: Diagnosis not present

## 2018-06-18 DIAGNOSIS — F900 Attention-deficit hyperactivity disorder, predominantly inattentive type: Secondary | ICD-10-CM | POA: Diagnosis not present

## 2018-06-18 DIAGNOSIS — F4321 Adjustment disorder with depressed mood: Secondary | ICD-10-CM | POA: Diagnosis not present

## 2018-06-18 DIAGNOSIS — F3181 Bipolar II disorder: Secondary | ICD-10-CM | POA: Diagnosis not present

## 2018-06-18 DIAGNOSIS — F410 Panic disorder [episodic paroxysmal anxiety] without agoraphobia: Secondary | ICD-10-CM | POA: Diagnosis not present

## 2018-06-22 ENCOUNTER — Other Ambulatory Visit: Payer: Self-pay | Admitting: Sports Medicine

## 2018-06-22 DIAGNOSIS — M542 Cervicalgia: Secondary | ICD-10-CM

## 2018-07-18 DIAGNOSIS — M5412 Radiculopathy, cervical region: Secondary | ICD-10-CM | POA: Diagnosis not present

## 2018-07-22 DIAGNOSIS — M545 Low back pain: Secondary | ICD-10-CM | POA: Diagnosis not present

## 2018-07-22 DIAGNOSIS — G894 Chronic pain syndrome: Secondary | ICD-10-CM | POA: Diagnosis not present

## 2018-07-22 DIAGNOSIS — M542 Cervicalgia: Secondary | ICD-10-CM | POA: Diagnosis not present

## 2018-07-22 DIAGNOSIS — Z79899 Other long term (current) drug therapy: Secondary | ICD-10-CM | POA: Diagnosis not present

## 2018-07-22 DIAGNOSIS — Z79891 Long term (current) use of opiate analgesic: Secondary | ICD-10-CM | POA: Diagnosis not present

## 2018-07-22 DIAGNOSIS — M5136 Other intervertebral disc degeneration, lumbar region: Secondary | ICD-10-CM | POA: Diagnosis not present

## 2018-08-13 DIAGNOSIS — Z1231 Encounter for screening mammogram for malignant neoplasm of breast: Secondary | ICD-10-CM | POA: Diagnosis not present

## 2018-08-19 DIAGNOSIS — M5136 Other intervertebral disc degeneration, lumbar region: Secondary | ICD-10-CM | POA: Diagnosis not present

## 2018-08-19 DIAGNOSIS — M542 Cervicalgia: Secondary | ICD-10-CM | POA: Diagnosis not present

## 2018-08-19 DIAGNOSIS — G894 Chronic pain syndrome: Secondary | ICD-10-CM | POA: Diagnosis not present

## 2018-08-19 DIAGNOSIS — M5416 Radiculopathy, lumbar region: Secondary | ICD-10-CM | POA: Diagnosis not present

## 2018-08-20 DIAGNOSIS — F900 Attention-deficit hyperactivity disorder, predominantly inattentive type: Secondary | ICD-10-CM | POA: Diagnosis not present

## 2018-08-20 DIAGNOSIS — F3181 Bipolar II disorder: Secondary | ICD-10-CM | POA: Diagnosis not present

## 2018-08-20 DIAGNOSIS — F410 Panic disorder [episodic paroxysmal anxiety] without agoraphobia: Secondary | ICD-10-CM | POA: Diagnosis not present

## 2018-09-10 ENCOUNTER — Other Ambulatory Visit: Payer: Self-pay | Admitting: Sports Medicine

## 2018-09-10 DIAGNOSIS — R300 Dysuria: Secondary | ICD-10-CM | POA: Diagnosis not present

## 2018-09-10 DIAGNOSIS — N390 Urinary tract infection, site not specified: Secondary | ICD-10-CM | POA: Diagnosis not present

## 2018-09-10 DIAGNOSIS — M542 Cervicalgia: Secondary | ICD-10-CM

## 2018-09-18 DIAGNOSIS — M542 Cervicalgia: Secondary | ICD-10-CM | POA: Diagnosis not present

## 2018-09-18 DIAGNOSIS — Z79891 Long term (current) use of opiate analgesic: Secondary | ICD-10-CM | POA: Diagnosis not present

## 2018-09-18 DIAGNOSIS — M5136 Other intervertebral disc degeneration, lumbar region: Secondary | ICD-10-CM | POA: Diagnosis not present

## 2018-09-18 DIAGNOSIS — Z79899 Other long term (current) drug therapy: Secondary | ICD-10-CM | POA: Diagnosis not present

## 2018-09-18 DIAGNOSIS — G894 Chronic pain syndrome: Secondary | ICD-10-CM | POA: Diagnosis not present

## 2018-09-25 ENCOUNTER — Other Ambulatory Visit: Payer: Self-pay | Admitting: Sports Medicine

## 2018-09-25 DIAGNOSIS — M542 Cervicalgia: Secondary | ICD-10-CM

## 2018-09-27 ENCOUNTER — Other Ambulatory Visit: Payer: Self-pay | Admitting: Sports Medicine

## 2018-09-27 DIAGNOSIS — M542 Cervicalgia: Secondary | ICD-10-CM

## 2018-10-10 ENCOUNTER — Other Ambulatory Visit: Payer: Self-pay | Admitting: Sports Medicine

## 2018-10-10 DIAGNOSIS — M542 Cervicalgia: Secondary | ICD-10-CM

## 2018-10-13 ENCOUNTER — Other Ambulatory Visit: Payer: Self-pay | Admitting: Sports Medicine

## 2018-10-13 DIAGNOSIS — M542 Cervicalgia: Secondary | ICD-10-CM

## 2018-10-15 DIAGNOSIS — M79604 Pain in right leg: Secondary | ICD-10-CM | POA: Diagnosis not present

## 2018-10-15 DIAGNOSIS — M5136 Other intervertebral disc degeneration, lumbar region: Secondary | ICD-10-CM | POA: Diagnosis not present

## 2018-10-15 DIAGNOSIS — G894 Chronic pain syndrome: Secondary | ICD-10-CM | POA: Diagnosis not present

## 2018-10-15 DIAGNOSIS — M5416 Radiculopathy, lumbar region: Secondary | ICD-10-CM | POA: Diagnosis not present

## 2018-11-05 DIAGNOSIS — R450 Nervousness: Secondary | ICD-10-CM | POA: Diagnosis not present

## 2018-11-05 DIAGNOSIS — X58XXXA Exposure to other specified factors, initial encounter: Secondary | ICD-10-CM | POA: Diagnosis not present

## 2018-11-05 DIAGNOSIS — Z79899 Other long term (current) drug therapy: Secondary | ICD-10-CM | POA: Diagnosis not present

## 2018-11-05 DIAGNOSIS — F17210 Nicotine dependence, cigarettes, uncomplicated: Secondary | ICD-10-CM | POA: Diagnosis not present

## 2018-11-05 DIAGNOSIS — K219 Gastro-esophageal reflux disease without esophagitis: Secondary | ICD-10-CM | POA: Diagnosis not present

## 2018-11-05 DIAGNOSIS — T7840XA Allergy, unspecified, initial encounter: Secondary | ICD-10-CM | POA: Diagnosis not present

## 2018-11-05 DIAGNOSIS — F419 Anxiety disorder, unspecified: Secondary | ICD-10-CM | POA: Diagnosis not present

## 2018-11-12 DIAGNOSIS — Z79891 Long term (current) use of opiate analgesic: Secondary | ICD-10-CM | POA: Diagnosis not present

## 2018-11-12 DIAGNOSIS — M542 Cervicalgia: Secondary | ICD-10-CM | POA: Diagnosis not present

## 2018-11-12 DIAGNOSIS — G894 Chronic pain syndrome: Secondary | ICD-10-CM | POA: Diagnosis not present

## 2018-11-12 DIAGNOSIS — M79604 Pain in right leg: Secondary | ICD-10-CM | POA: Diagnosis not present

## 2018-11-12 DIAGNOSIS — Z79899 Other long term (current) drug therapy: Secondary | ICD-10-CM | POA: Diagnosis not present

## 2018-11-12 DIAGNOSIS — M5416 Radiculopathy, lumbar region: Secondary | ICD-10-CM | POA: Diagnosis not present

## 2019-01-07 DIAGNOSIS — M542 Cervicalgia: Secondary | ICD-10-CM | POA: Diagnosis not present

## 2019-01-07 DIAGNOSIS — M5416 Radiculopathy, lumbar region: Secondary | ICD-10-CM | POA: Diagnosis not present

## 2019-01-07 DIAGNOSIS — M5136 Other intervertebral disc degeneration, lumbar region: Secondary | ICD-10-CM | POA: Diagnosis not present

## 2019-01-07 DIAGNOSIS — Z79899 Other long term (current) drug therapy: Secondary | ICD-10-CM | POA: Diagnosis not present

## 2019-01-07 DIAGNOSIS — Z79891 Long term (current) use of opiate analgesic: Secondary | ICD-10-CM | POA: Diagnosis not present

## 2019-01-07 DIAGNOSIS — G894 Chronic pain syndrome: Secondary | ICD-10-CM | POA: Diagnosis not present

## 2019-05-23 ENCOUNTER — Telehealth: Payer: Self-pay | Admitting: Osteopathic Medicine

## 2021-11-28 ENCOUNTER — Other Ambulatory Visit: Payer: Self-pay

## 2021-11-28 ENCOUNTER — Encounter (HOSPITAL_COMMUNITY): Payer: Self-pay

## 2021-11-28 ENCOUNTER — Emergency Department (HOSPITAL_COMMUNITY)
Admission: EM | Admit: 2021-11-28 | Discharge: 2021-11-28 | Disposition: A | Discharge status: Home / Self Care | Payer: Medicaid Other | Attending: Emergency Medicine | Admitting: Emergency Medicine

## 2021-11-28 ENCOUNTER — Emergency Department (HOSPITAL_COMMUNITY): Payer: Medicaid Other

## 2021-11-28 DIAGNOSIS — Y92007 Garden or yard of unspecified non-institutional (private) residence as the place of occurrence of the external cause: Secondary | ICD-10-CM | POA: Diagnosis not present

## 2021-11-28 DIAGNOSIS — W19XXXA Unspecified fall, initial encounter: Secondary | ICD-10-CM | POA: Diagnosis not present

## 2021-11-28 DIAGNOSIS — S8392XA Sprain of unspecified site of left knee, initial encounter: Secondary | ICD-10-CM | POA: Insufficient documentation

## 2021-11-28 DIAGNOSIS — S8992XA Unspecified injury of left lower leg, initial encounter: Secondary | ICD-10-CM | POA: Diagnosis present

## 2021-11-28 DIAGNOSIS — M25562 Pain in left knee: Secondary | ICD-10-CM | POA: Insufficient documentation

## 2021-11-28 MED ORDER — HYDROCODONE-ACETAMINOPHEN 5-325 MG PO TABS
1.0000 | ORAL_TABLET | ORAL | 0 refills | Status: DC | PRN
Start: 2021-11-28 — End: 2021-12-08

## 2021-11-28 MED ORDER — OXYCODONE-ACETAMINOPHEN 5-325 MG PO TABS
1.0000 | ORAL_TABLET | Freq: Once | ORAL | Status: AC
  Administered 2021-11-28: 1 via ORAL
  Filled 2021-11-28: qty 1

## 2021-12-08 ENCOUNTER — Ambulatory Visit (INDEPENDENT_AMBULATORY_CARE_PROVIDER_SITE_OTHER): Payer: Medicaid Other | Admitting: Sports Medicine

## 2021-12-08 ENCOUNTER — Other Ambulatory Visit: Payer: Self-pay

## 2021-12-08 ENCOUNTER — Ambulatory Visit (INDEPENDENT_AMBULATORY_CARE_PROVIDER_SITE_OTHER): Payer: Medicaid Other

## 2021-12-08 DIAGNOSIS — S8992XA Unspecified injury of left lower leg, initial encounter: Secondary | ICD-10-CM

## 2021-12-08 DIAGNOSIS — S83512A Sprain of anterior cruciate ligament of left knee, initial encounter: Secondary | ICD-10-CM | POA: Insufficient documentation

## 2021-12-08 MED ORDER — HYDROCODONE-ACETAMINOPHEN 5-325 MG PO TABS: 1.0000 | ORAL_TABLET | Freq: Three times a day (TID) | ORAL | 0 refills | Status: DC | PRN

## 2021-12-18 ENCOUNTER — Other Ambulatory Visit: Payer: Self-pay

## 2021-12-18 ENCOUNTER — Ambulatory Visit (INDEPENDENT_AMBULATORY_CARE_PROVIDER_SITE_OTHER): Payer: Medicaid Other

## 2021-12-18 DIAGNOSIS — W010XXD Fall on same level from slipping, tripping and stumbling without subsequent striking against object, subsequent encounter: Secondary | ICD-10-CM | POA: Diagnosis not present

## 2021-12-18 DIAGNOSIS — M25562 Pain in left knee: Secondary | ICD-10-CM

## 2021-12-18 DIAGNOSIS — S8992XA Unspecified injury of left lower leg, initial encounter: Secondary | ICD-10-CM

## 2021-12-19 ENCOUNTER — Other Ambulatory Visit: Payer: Self-pay | Admitting: Sports Medicine

## 2021-12-19 DIAGNOSIS — S8992XA Unspecified injury of left lower leg, initial encounter: Secondary | ICD-10-CM

## 2021-12-19 DIAGNOSIS — S83512D Sprain of anterior cruciate ligament of left knee, subsequent encounter: Secondary | ICD-10-CM

## 2021-12-19 MED ORDER — HYDROCODONE-ACETAMINOPHEN 5-325 MG PO TABS: 1.0000 | ORAL_TABLET | Freq: Three times a day (TID) | ORAL | 0 refills | Status: DC | PRN

## 2021-12-20 ENCOUNTER — Other Ambulatory Visit: Payer: Self-pay

## 2021-12-20 ENCOUNTER — Encounter (HOSPITAL_BASED_OUTPATIENT_CLINIC_OR_DEPARTMENT_OTHER): Payer: Self-pay | Admitting: Orthopaedic Surgery

## 2021-12-28 ENCOUNTER — Ambulatory Visit (HOSPITAL_BASED_OUTPATIENT_CLINIC_OR_DEPARTMENT_OTHER)
Admission: RE | Admit: 2021-12-28 | Discharge: 2021-12-28 | Disposition: A | Discharge status: Home / Self Care | Payer: Medicaid Other | Attending: Orthopaedic Surgery | Admitting: Orthopaedic Surgery

## 2021-12-28 ENCOUNTER — Ambulatory Visit (HOSPITAL_BASED_OUTPATIENT_CLINIC_OR_DEPARTMENT_OTHER): Payer: Medicaid Other | Admitting: Certified Registered"

## 2021-12-28 ENCOUNTER — Encounter (HOSPITAL_BASED_OUTPATIENT_CLINIC_OR_DEPARTMENT_OTHER): Payer: Self-pay | Admitting: Orthopaedic Surgery

## 2021-12-28 ENCOUNTER — Other Ambulatory Visit: Payer: Self-pay | Admitting: Sports Medicine

## 2021-12-28 ENCOUNTER — Other Ambulatory Visit: Payer: Self-pay

## 2021-12-28 ENCOUNTER — Encounter (HOSPITAL_BASED_OUTPATIENT_CLINIC_OR_DEPARTMENT_OTHER)
Admission: RE | Disposition: A | Discharge status: Home / Self Care | Payer: Self-pay | Source: Home / Self Care | Attending: Orthopaedic Surgery

## 2021-12-28 DIAGNOSIS — R519 Headache, unspecified: Secondary | ICD-10-CM | POA: Diagnosis not present

## 2021-12-28 DIAGNOSIS — G8929 Other chronic pain: Secondary | ICD-10-CM | POA: Insufficient documentation

## 2021-12-28 DIAGNOSIS — S83512A Sprain of anterior cruciate ligament of left knee, initial encounter: Secondary | ICD-10-CM | POA: Insufficient documentation

## 2021-12-28 DIAGNOSIS — Z87442 Personal history of urinary calculi: Secondary | ICD-10-CM | POA: Diagnosis not present

## 2021-12-28 DIAGNOSIS — F17210 Nicotine dependence, cigarettes, uncomplicated: Secondary | ICD-10-CM | POA: Insufficient documentation

## 2021-12-28 DIAGNOSIS — F418 Other specified anxiety disorders: Secondary | ICD-10-CM | POA: Insufficient documentation

## 2021-12-28 DIAGNOSIS — S83282A Other tear of lateral meniscus, current injury, left knee, initial encounter: Secondary | ICD-10-CM | POA: Diagnosis not present

## 2021-12-28 DIAGNOSIS — W19XXXA Unspecified fall, initial encounter: Secondary | ICD-10-CM | POA: Insufficient documentation

## 2021-12-28 DIAGNOSIS — M1990 Unspecified osteoarthritis, unspecified site: Secondary | ICD-10-CM | POA: Insufficient documentation

## 2021-12-28 HISTORY — PX: ANTERIOR CRUCIATE LIGAMENT REPAIR: SHX115

## 2021-12-28 LAB — POCT PREGNANCY, URINE: Preg Test, Ur: NEGATIVE

## 2021-12-28 SURGERY — REPAIR, KNEE, ACL
Anesthesia: Regional | Site: Knee | Laterality: Left

## 2021-12-28 MED ORDER — PROPOFOL 10 MG/ML IV BOLUS
INTRAVENOUS | Status: AC
  Filled 2021-12-28: qty 20

## 2021-12-28 MED ORDER — SCOPOLAMINE 1 MG/3DAYS TD PT72
1.0000 | MEDICATED_PATCH | TRANSDERMAL | Status: DC
  Administered 2021-12-28: 1.5 mg via TRANSDERMAL

## 2021-12-28 MED ORDER — MELOXICAM 15 MG PO TABS: 15.0000 mg | ORAL_TABLET | Freq: Every day | ORAL | 0 refills | Status: AC

## 2021-12-28 MED ORDER — FENTANYL CITRATE (PF) 100 MCG/2ML IJ SOLN
INTRAMUSCULAR | Status: DC | PRN
  Administered 2021-12-28 (×4): 25 ug via INTRAVENOUS

## 2021-12-28 MED ORDER — OXYCODONE HCL 5 MG/5ML PO SOLN: 5.0000 mg | Freq: Once | ORAL | Status: AC | PRN

## 2021-12-28 MED ORDER — LIDOCAINE 2% (20 MG/ML) 5 ML SYRINGE
INTRAMUSCULAR | Status: AC
  Filled 2021-12-28: qty 5

## 2021-12-28 MED ORDER — PHENYLEPHRINE 40 MCG/ML (10ML) SYRINGE FOR IV PUSH (FOR BLOOD PRESSURE SUPPORT)
PREFILLED_SYRINGE | INTRAVENOUS | Status: AC
  Filled 2021-12-28: qty 10

## 2021-12-28 MED ORDER — HYDROMORPHONE HCL 1 MG/ML IJ SOLN
0.5000 mg | INTRAMUSCULAR | Status: DC | PRN
  Administered 2021-12-28 (×3): 0.5 mg via INTRAVENOUS

## 2021-12-28 MED ORDER — HYDROMORPHONE HCL 1 MG/ML IJ SOLN
INTRAMUSCULAR | Status: AC
  Filled 2021-12-28: qty 0.5

## 2021-12-28 MED ORDER — LACTATED RINGERS IV SOLN: INTRAVENOUS | Status: DC

## 2021-12-28 MED ORDER — CEFAZOLIN SODIUM-DEXTROSE 2-4 GM/100ML-% IV SOLN
INTRAVENOUS | Status: AC
  Filled 2021-12-28: qty 100

## 2021-12-28 MED ORDER — ONDANSETRON HCL 4 MG/2ML IJ SOLN
INTRAMUSCULAR | Status: AC
  Filled 2021-12-28: qty 2

## 2021-12-28 MED ORDER — FENTANYL CITRATE (PF) 100 MCG/2ML IJ SOLN
INTRAMUSCULAR | Status: AC
  Filled 2021-12-28: qty 2

## 2021-12-28 MED ORDER — DEXAMETHASONE SODIUM PHOSPHATE 10 MG/ML IJ SOLN
INTRAMUSCULAR | Status: AC
  Filled 2021-12-28: qty 1

## 2021-12-28 MED ORDER — PHENYLEPHRINE HCL (PRESSORS) 10 MG/ML IV SOLN
INTRAVENOUS | Status: DC | PRN
  Administered 2021-12-28: 40 ug via INTRAVENOUS

## 2021-12-28 MED ORDER — FENTANYL CITRATE (PF) 100 MCG/2ML IJ SOLN
100.0000 ug | Freq: Once | INTRAMUSCULAR | Status: AC
  Administered 2021-12-28: 50 ug via INTRAVENOUS

## 2021-12-28 MED ORDER — PROPOFOL 10 MG/ML IV BOLUS
INTRAVENOUS | Status: DC | PRN
  Administered 2021-12-28: 200 mg via INTRAVENOUS

## 2021-12-28 MED ORDER — VANCOMYCIN HCL 1000 MG IV SOLR
INTRAVENOUS | Status: AC
  Filled 2021-12-28: qty 20

## 2021-12-28 MED ORDER — ONDANSETRON HCL 4 MG PO TABS: 4.0000 mg | ORAL_TABLET | Freq: Three times a day (TID) | ORAL | 0 refills | Status: AC | PRN

## 2021-12-28 MED ORDER — SODIUM CHLORIDE 0.9 % IR SOLN
Status: DC | PRN
  Administered 2021-12-28: 6000 mL

## 2021-12-28 MED ORDER — BUPIVACAINE HCL (PF) 0.25 % IJ SOLN
INTRAMUSCULAR | Status: AC
  Filled 2021-12-28: qty 30

## 2021-12-28 MED ORDER — VANCOMYCIN HCL 1000 MG IV SOLR
INTRAVENOUS | Status: DC | PRN
  Administered 2021-12-28: 1000 mg via TOPICAL

## 2021-12-28 MED ORDER — ACETAMINOPHEN 500 MG PO TABS
ORAL_TABLET | ORAL | Status: AC
  Filled 2021-12-28: qty 2

## 2021-12-28 MED ORDER — ACETAMINOPHEN 500 MG PO TABS
1000.0000 mg | ORAL_TABLET | Freq: Once | ORAL | Status: AC
  Administered 2021-12-28: 1000 mg via ORAL

## 2021-12-28 MED ORDER — ASPIRIN 81 MG PO CHEW: 81.0000 mg | CHEWABLE_TABLET | Freq: Two times a day (BID) | ORAL | 0 refills | Status: AC

## 2021-12-28 MED ORDER — LIDOCAINE HCL (CARDIAC) PF 100 MG/5ML IV SOSY
PREFILLED_SYRINGE | INTRAVENOUS | Status: DC | PRN
  Administered 2021-12-28: 80 mg via INTRAVENOUS

## 2021-12-28 MED ORDER — ONDANSETRON HCL 4 MG/2ML IJ SOLN
INTRAMUSCULAR | Status: DC | PRN
  Administered 2021-12-28: 4 mg via INTRAVENOUS

## 2021-12-28 MED ORDER — OXYCODONE HCL 5 MG PO TABS
5.0000 mg | ORAL_TABLET | Freq: Once | ORAL | Status: AC | PRN
  Administered 2021-12-28: 5 mg via ORAL

## 2021-12-28 MED ORDER — EPHEDRINE SULFATE 50 MG/ML IJ SOLN
INTRAMUSCULAR | Status: DC | PRN
  Administered 2021-12-28 (×2): 10 mg via INTRAVENOUS
  Administered 2021-12-28: 5 mg via INTRAVENOUS

## 2021-12-28 MED ORDER — ACETAMINOPHEN 500 MG PO TABS
1000.0000 mg | ORAL_TABLET | Freq: Three times a day (TID) | ORAL | 0 refills | Status: AC
Start: 2021-12-28 — End: 2022-01-11

## 2021-12-28 MED ORDER — MIDAZOLAM HCL 2 MG/2ML IJ SOLN
2.0000 mg | Freq: Once | INTRAMUSCULAR | Status: AC
  Administered 2021-12-28: 2 mg via INTRAVENOUS

## 2021-12-28 MED ORDER — METHOCARBAMOL 500 MG PO TABS
500.0000 mg | ORAL_TABLET | Freq: Three times a day (TID) | ORAL | 0 refills | Status: AC | PRN
Start: 2021-12-28 — End: ?

## 2021-12-28 MED ORDER — FENTANYL CITRATE (PF) 100 MCG/2ML IJ SOLN
25.0000 ug | INTRAMUSCULAR | Status: DC | PRN
  Administered 2021-12-28 (×2): 50 ug via INTRAVENOUS

## 2021-12-28 MED ORDER — DEXAMETHASONE SODIUM PHOSPHATE 10 MG/ML IJ SOLN
INTRAMUSCULAR | Status: DC | PRN
Start: 2021-12-28 — End: 2021-12-28
  Administered 2021-12-28: 10 mg via INTRAVENOUS

## 2021-12-28 MED ORDER — SCOPOLAMINE 1 MG/3DAYS TD PT72
MEDICATED_PATCH | TRANSDERMAL | Status: AC
  Filled 2021-12-28: qty 1

## 2021-12-28 MED ORDER — EPHEDRINE 5 MG/ML INJ
INTRAVENOUS | Status: AC
  Filled 2021-12-28: qty 5

## 2021-12-28 MED ORDER — OXYCODONE HCL 5 MG PO TABS
ORAL_TABLET | ORAL | Status: AC
  Filled 2021-12-28: qty 1

## 2021-12-28 MED ORDER — MIDAZOLAM HCL 2 MG/2ML IJ SOLN
INTRAMUSCULAR | Status: AC
  Filled 2021-12-28: qty 2

## 2021-12-28 MED ORDER — CEFAZOLIN SODIUM-DEXTROSE 2-4 GM/100ML-% IV SOLN
2.0000 g | INTRAVENOUS | Status: AC
  Administered 2021-12-28: 2 g via INTRAVENOUS

## 2021-12-28 MED ORDER — PROMETHAZINE HCL 25 MG/ML IJ SOLN: 6.2500 mg | INTRAMUSCULAR | Status: DC | PRN

## 2021-12-28 MED ORDER — ROPIVACAINE HCL 5 MG/ML IJ SOLN
INTRAMUSCULAR | Status: DC | PRN
Start: 2021-12-28 — End: 2021-12-28
  Administered 2021-12-28: 30 mL via PERINEURAL

## 2021-12-28 MED ORDER — AMISULPRIDE (ANTIEMETIC) 5 MG/2ML IV SOLN: 10.0000 mg | Freq: Once | INTRAVENOUS | Status: DC | PRN

## 2021-12-28 MED ORDER — EPINEPHRINE PF 1 MG/ML IJ SOLN
INTRAMUSCULAR | Status: AC
  Filled 2021-12-28: qty 1

## 2021-12-28 MED ORDER — OXYCODONE HCL 5 MG PO TABS: ORAL_TABLET | ORAL | 0 refills | Status: AC

## 2021-12-28 SURGICAL SUPPLY — 79 items
APL PRP STRL LF DISP 70% ISPRP (MISCELLANEOUS) ×1
BLADE SHAVER BONE 5.0X13 (MISCELLANEOUS) ×1 IMPLANT
BLADE SURG 10 STRL SS (BLADE) ×1 IMPLANT
BLADE SURG 15 STRL LF DISP TIS (BLADE) ×1 IMPLANT
BLADE SURG 15 STRL SS (BLADE) ×1
BNDG COHESIVE 4X5 TAN ST LF (GAUZE/BANDAGES/DRESSINGS) IMPLANT
BNDG ELASTIC 6X5.8 VLCR STR LF (GAUZE/BANDAGES/DRESSINGS) ×1 IMPLANT
BONE TUNNEL PLUG CANNULATED (MISCELLANEOUS) IMPLANT
BURR OVAL 8 FLU 4.0X13 (MISCELLANEOUS) IMPLANT
CHLORAPREP W/TINT 26 (MISCELLANEOUS) ×1 IMPLANT
COLLECTOR GRAFT TISSUE (SYSTAGENIX WOUND MANAGEMENT) ×1
COOLER ICEMAN CLASSIC (MISCELLANEOUS) ×1 IMPLANT
COVER BACK TABLE 60X90IN (DRAPES) ×1 IMPLANT
CUFF TOURN SGL QUICK 34 (TOURNIQUET CUFF) ×1
CUFF TRNQT CYL 34X4.125X (TOURNIQUET CUFF) IMPLANT
CUTTER TENSIONER SUT 2-0 0 FBW (INSTRUMENTS) IMPLANT
DECANTER SPIKE VIAL GLASS SM (MISCELLANEOUS) IMPLANT
DISSECTOR 3.5MM X 13CM CVD (MISCELLANEOUS) IMPLANT
DISSECTOR 4.0MMX13CM CVD (MISCELLANEOUS) ×1 IMPLANT
DRAPE IMP U-DRAPE 54X76 (DRAPES) IMPLANT
DRAPE OEC MINIVIEW 54X84 (DRAPES) ×1 IMPLANT
DRAPE U-SHAPE 47X51 STRL (DRAPES) ×1 IMPLANT
DRAPE-T ARTHROSCOPY W/POUCH (DRAPES) ×1 IMPLANT
ELECT REM PT RETURN 9FT ADLT (ELECTROSURGICAL) ×1
ELECTRODE REM PT RTRN 9FT ADLT (ELECTROSURGICAL) ×1 IMPLANT
FIBERSTICK 2 (SUTURE) IMPLANT
GAUZE SPONGE 4X4 12PLY STRL (GAUZE/BANDAGES/DRESSINGS) ×2 IMPLANT
GLOVE SRG 8 PF TXTR STRL LF DI (GLOVE) ×1 IMPLANT
GLOVE SURG ENC MOIS LTX SZ6.5 (GLOVE) ×1 IMPLANT
GLOVE SURG LTX SZ8 (GLOVE) ×1 IMPLANT
GLOVE SURG UNDER POLY LF SZ6.5 (GLOVE) ×1 IMPLANT
GLOVE SURG UNDER POLY LF SZ8 (GLOVE) ×1
GOWN STRL REUS W/ TWL LRG LVL3 (GOWN DISPOSABLE) ×2 IMPLANT
GOWN STRL REUS W/TWL LRG LVL3 (GOWN DISPOSABLE) ×2
GOWN STRL REUS W/TWL XL LVL3 (GOWN DISPOSABLE) ×1 IMPLANT
GUIDEPIN FLEX PATHFINDER 2.4MM (WIRE) IMPLANT
HARVESTER TENDON QUADPRO 10 (ORTHOPEDIC DISPOSABLE SUPPLIES) IMPLANT
IMMOBILIZER KNEE 20 (SOFTGOODS)
IMMOBILIZER KNEE 20 THIGH 36 (SOFTGOODS) IMPLANT
IMMOBILIZER KNEE 22 UNIV (SOFTGOODS) IMPLANT
IV NS IRRIG 3000ML ARTHROMATIC (IV SOLUTION) ×4 IMPLANT
KIT TRANSTIBIAL (DISPOSABLE) ×1 IMPLANT
NDL SAFETY ECLIPSE 18X1.5 (NEEDLE) ×1 IMPLANT
NDL SUT 2-0 SCORPION KNEE (NEEDLE) IMPLANT
NEEDLE HYPO 18GX1.5 SHARP (NEEDLE) ×1
NEEDLE SUT 2-0 SCORPION KNEE (NEEDLE) IMPLANT
NS IRRIG 1000ML POUR BTL (IV SOLUTION) ×1 IMPLANT
PACK ARTHROSCOPY DSU (CUSTOM PROCEDURE TRAY) ×1 IMPLANT
PACK BASIN DAY SURGERY FS (CUSTOM PROCEDURE TRAY) IMPLANT
PAD CAST 4YDX4 CTTN HI CHSV (CAST SUPPLIES) ×1 IMPLANT
PAD COLD SHLDR WRAP-ON (PAD) ×1 IMPLANT
PADDING CAST COTTON 4X4 STRL (CAST SUPPLIES) ×1
PENCIL SMOKE EVACUATOR (MISCELLANEOUS) ×1 IMPLANT
PORT APPOLLO RF 90DEGREE MULTI (SURGICAL WAND) ×1 IMPLANT
SCREW BIOCOMPOSITE 8X20 INTER (Screw) IMPLANT
SCREW FT BIOCOMP 9X30 (Screw) IMPLANT
SHEET MEDIUM DRAPE 40X70 STRL (DRAPES) ×1 IMPLANT
SLEEVE SCD COMPRESS KNEE MED (STOCKING) ×1 IMPLANT
SPONGE T-LAP 4X18 ~~LOC~~+RFID (SPONGE) ×1 IMPLANT
STRIP CLOSURE SKIN 1/2X4 (GAUZE/BANDAGES/DRESSINGS) IMPLANT
SUT 0 FIBERLOOP 38 BLUE TPR ND (SUTURE)
SUT 2 FIBERLOOP 20 STRT BLUE (SUTURE)
SUT FIBERWIRE #2 38 T-5 BLUE (SUTURE)
SUT MNCRL AB 4-0 PS2 18 (SUTURE) ×1 IMPLANT
SUT PDS AB 0 CT 36 (SUTURE) IMPLANT
SUT VIC AB 0 CT1 27 (SUTURE) ×2
SUT VIC AB 0 CT1 27XBRD ANBCTR (SUTURE) ×2 IMPLANT
SUT VIC AB 1 CT1 27 (SUTURE) ×1
SUT VIC AB 1 CT1 27XBRD ANBCTR (SUTURE) IMPLANT
SUT VIC AB 3-0 SH 27 (SUTURE) ×1
SUT VIC AB 3-0 SH 27X BRD (SUTURE) ×1 IMPLANT
SUTURE 0 FIBERLP 38 BLU TPR ND (SUTURE) IMPLANT
SUTURE 2 FIBERLOOP 20 STRT BLU (SUTURE) IMPLANT
SUTURE FIBERWR #2 38 T-5 BLUE (SUTURE) IMPLANT
TISSUE GRAFT COLLECTOR (SYSTAGENIX WOUND MANAGEMENT) IMPLANT
TOWEL GREEN STERILE FF (TOWEL DISPOSABLE) ×1 IMPLANT
TUBE CONNECTING 20X1/4 (TUBING) ×1 IMPLANT
TUBE SUCTION HIGH CAP CLEAR NV (SUCTIONS) ×1 IMPLANT
TUBING ARTHROSCOPY IRRIG 16FT (MISCELLANEOUS) ×1 IMPLANT

## 2021-12-29 ENCOUNTER — Encounter (HOSPITAL_BASED_OUTPATIENT_CLINIC_OR_DEPARTMENT_OTHER): Payer: Self-pay | Admitting: Orthopaedic Surgery

## 2021-12-30 ENCOUNTER — Encounter: Payer: Self-pay | Admitting: Physical Therapy

## 2021-12-30 ENCOUNTER — Other Ambulatory Visit: Payer: Self-pay

## 2021-12-30 ENCOUNTER — Ambulatory Visit: Payer: Medicaid Other | Attending: Orthopaedic Surgery | Admitting: Physical Therapy

## 2021-12-30 DIAGNOSIS — R262 Difficulty in walking, not elsewhere classified: Secondary | ICD-10-CM

## 2021-12-30 DIAGNOSIS — M6281 Muscle weakness (generalized): Secondary | ICD-10-CM

## 2021-12-30 DIAGNOSIS — M25562 Pain in left knee: Secondary | ICD-10-CM | POA: Diagnosis present

## 2022-01-13 ENCOUNTER — Other Ambulatory Visit: Payer: Self-pay

## 2022-01-13 ENCOUNTER — Ambulatory Visit: Payer: Medicaid Other | Attending: Orthopaedic Surgery | Admitting: Physical Therapy

## 2022-01-13 DIAGNOSIS — M25562 Pain in left knee: Secondary | ICD-10-CM | POA: Diagnosis present

## 2022-01-13 DIAGNOSIS — M6281 Muscle weakness (generalized): Secondary | ICD-10-CM

## 2022-01-13 DIAGNOSIS — R262 Difficulty in walking, not elsewhere classified: Secondary | ICD-10-CM | POA: Diagnosis present

## 2022-01-20 ENCOUNTER — Ambulatory Visit: Payer: Medicaid Other | Admitting: Physical Therapy

## 2022-01-20 ENCOUNTER — Other Ambulatory Visit: Payer: Self-pay

## 2022-01-20 DIAGNOSIS — M25562 Pain in left knee: Secondary | ICD-10-CM

## 2022-01-20 DIAGNOSIS — M6281 Muscle weakness (generalized): Secondary | ICD-10-CM

## 2022-01-20 DIAGNOSIS — R262 Difficulty in walking, not elsewhere classified: Secondary | ICD-10-CM

## 2022-01-20 NOTE — Therapy (Addendum)
[facility] [place], [place], [address] Phone: [phone]   Fax:  [phone]  Physical Therapy Treatment and Discharge  Patient Details  Name: Shelly Cardenas MRN: [id number] Date of Birth: 01-Jan-1981 Referring Provider (PT): Griffin Basil   Encounter Date: 01/20/2022   PT End of Session - 01/20/22 1532     Visit Number 3    Number of Visits 4    Date for PT Re-Evaluation 01/30/22    Authorization Type medicaid of [place]    Authorization - Visit Number 3    Authorization - Number of Visits 4    Progress Note Due on Visit 4    PT Start Time 6203    PT Stop Time 1530    PT Time Calculation (min) 45 min    Activity Tolerance Patient tolerated treatment well    Behavior During Therapy [facility] for tasks assessed/performed             Past Medical History:  Diagnosis Date   Anxiety    Chronic LBP    Chronic pain syndrome 12/30/2015   Depression    Failure to attend appointment 07/03/2016   07/03/16 FOR ANXIETY MEDICATION MANAGEMENT - NO REFILLS   Generalized anxiety disorder 12/30/2015   Following with [place] psychiatry, Dr Drucilla Chalet Pool. On Xanax, Lunesta. Records indicate patient should also be taking Zoloft however she did not report this on her intake our clinic, will defer to psychiatry   Migraine     Past Surgical History:  Procedure Laterality Date   ANTERIOR CRUCIATE LIGAMENT REPAIR Left 12/28/2021   Procedure: ANTERIOR CRUCIATE LIGAMENT (ACL) REPAIR with quadriceps tendon autograft partial lateral menisectomy;  Surgeon: Hiram Gash, MD;  Location: [facility];  Service: Orthopedics;  Laterality: Left;   TUBAL LIGATION      There were no vitals filed for this visit.   Subjective Assessment - 01/20/22 1452     Subjective Pt states she just started work back and it's been going well. She states her knee gets swollen but she is able to reduce pain and swelling with ice    Patient  Stated Goals decrease pain and return to full home and work duties    Currently in Pain? No/denies                               [facility] Adult PT Treatment/Exercise - 01/20/22 0001       Ambulation/Gait   Assistive device None   Lt knee brace     Blood Flow Restriction   Blood Flow Restriction Yes      Blood Flow Restriction-Positions    Blood Flow Restriction Position Supine      BFR-Supine   Supine Exercise Pressure (mmHg) 140    Supine Exercise Prescription 30,15,15,15, reps w/ 30-60 sec rest    Supine Exercise Prescription Comment SLR      Knee/Hip Exercises: Aerobic   Other Aerobic seated heel slides to 90 degrees x 3 min for warm up      Knee/Hip Exercises: Standing   Heel Raises 20 reps    Wall Squat 15 reps    Wall Squat Limitations with ball behind back, to 45 degrees per protocol    SLS in brace to tolerance      Knee/Hip Exercises: Supine   Quad Sets Left;1 set;5 reps      Knee/Hip Exercises: Sidelying   Hip ABduction  Left;2 sets;15 reps    Hip ADduction Left;15 reps;2 sets      Knee/Hip Exercises: Prone   Hip Extension Left;2 sets;15 reps                       PT Short Term Goals - 12/30/21 1443       PT SHORT TERM GOAL #1   Title Pt will be independent in initial HEP    Baseline pt does not have HEP    Time 4    Period Weeks    Status New    Target Date 01/27/22      PT SHORT TERM GOAL #2   Title Pt will perform gait without AD x 150' independently (as allowed my MD)    Baseline ambulation with 2 crutches    Time 4    Period Weeks    Status New    Target Date 01/27/22               PT Long Term Goals - 12/30/21 1444       PT LONG TERM GOAL #1   Title Pt will be independent with advanced HEP    Baseline pt does not have HEP    Time 12    Period Weeks    Status New    Target Date 03/24/22      PT LONG TERM GOAL #2   Title Pt will improve Lt knee flexion ROM to 120 degrees to improve functional  mobility    Baseline 0 degrees (limited by protocol)    Time 12    Period Weeks    Status New    Target Date 03/24/22      PT LONG TERM GOAL #3   Title Pt will perform gait community distances without AD with pain <= 1/10    Baseline pt performs gait with 2 crutches    Time 12    Period Weeks    Status New    Target Date 03/24/22      PT LONG TERM GOAL #4   Title Pt will improve Lt LE strength to 4+/5 to be able to return to work    Baseline unable to assess strength due to protocol precautions    Time 12    Period Weeks    Status New    Target Date 03/24/22                   Plan - 01/20/22 1532     Clinical Impression Statement Pt with improving strength and exercise tolerance. Good tolerance to BFR this session. Goals ongoing    PT Next Visit Plan per protocol, BFR, standing balance, RECERT    PT Home Exercise Plan [id number]    Consulted and Agree with Plan of Care Patient             Patient will benefit from skilled therapeutic intervention in order to improve the following deficits and impairments:     Visit Diagnosis: Acute pain of left knee  Muscle weakness (generalized)  Difficulty in walking, not elsewhere classified     Problem List Patient Active Problem List   Diagnosis Date Noted   Left ACL tear 12/08/2021   Pain management 08/03/2017   Failure to attend appointment 07/03/2016   Benzodiazepine dependence ([place]) 06/22/2016   Constipation 12/30/2015   Colitis 12/30/2015   Chronic pain syndrome 12/30/2015   Generalized anxiety disorder 12/30/2015   Compulsive tobacco user syndrome 12/30/2015  Acute onset aura migraine 07/05/2015   Family history of diabetes mellitus in first degree relative 08/12/2014   DDD (degenerative disc disease), cervical 08/12/2014   Calculus of kidney 10/31/2013   Cyst of ovary 08/30/2012   Panic attack 01/23/2012   PHYSICAL THERAPY DISCHARGE SUMMARY  Visits from Start of Care: 3  Current functional  level related to goals / functional outcomes: Improved strength   Remaining deficits: See above   Education / Equipment: HEP   Patient agrees to discharge. Patient goals were partially met. Patient is being discharged due to not returning since the last visit.  Isabelle Course, PT,DPT03/29/238:39 AM   Isabelle Course, PT 01/20/2022, 3:34 PM  [facility] [place], [place], [address] Phone: [phone]   Fax:  [phone]  Name: Shelly Cardenas MRN: [id number] Date of Birth: Feb 07, 1981

## 2022-01-27 ENCOUNTER — Ambulatory Visit: Payer: Medicaid Other | Admitting: Medical-Surgical

## 2022-01-27 ENCOUNTER — Ambulatory Visit: Payer: Medicaid Other | Admitting: Physical Therapy

## 2022-01-27 ENCOUNTER — Other Ambulatory Visit: Payer: Self-pay

## 2022-02-03 ENCOUNTER — Ambulatory Visit: Payer: Medicaid Other | Admitting: Physical Therapy

## 2022-11-12 ENCOUNTER — Encounter (HOSPITAL_BASED_OUTPATIENT_CLINIC_OR_DEPARTMENT_OTHER): Payer: Self-pay | Admitting: Emergency Medicine

## 2022-11-12 ENCOUNTER — Emergency Department (HOSPITAL_BASED_OUTPATIENT_CLINIC_OR_DEPARTMENT_OTHER): Payer: Medicaid Other

## 2022-11-12 ENCOUNTER — Emergency Department (HOSPITAL_BASED_OUTPATIENT_CLINIC_OR_DEPARTMENT_OTHER)
Admission: EM | Admit: 2022-11-12 | Discharge: 2022-11-12 | Disposition: A | Discharge status: Home / Self Care | Payer: Medicaid Other | Attending: Emergency Medicine | Admitting: Emergency Medicine

## 2022-11-12 ENCOUNTER — Other Ambulatory Visit: Payer: Self-pay

## 2022-11-12 DIAGNOSIS — Z1152 Encounter for screening for COVID-19: Secondary | ICD-10-CM | POA: Insufficient documentation

## 2022-11-12 DIAGNOSIS — R300 Dysuria: Secondary | ICD-10-CM | POA: Insufficient documentation

## 2022-11-12 DIAGNOSIS — J101 Influenza due to other identified influenza virus with other respiratory manifestations: Secondary | ICD-10-CM | POA: Diagnosis not present

## 2022-11-12 DIAGNOSIS — R059 Cough, unspecified: Secondary | ICD-10-CM | POA: Diagnosis present

## 2022-11-12 LAB — URINALYSIS, ROUTINE W REFLEX MICROSCOPIC
Bilirubin Urine: NEGATIVE
Glucose, UA: NEGATIVE mg/dL
Ketones, ur: NEGATIVE mg/dL
Leukocytes,Ua: NEGATIVE
Nitrite: POSITIVE — AB
Protein, ur: 30 mg/dL — AB
Specific Gravity, Urine: >=1.03 (ref 1.005–1.030)
pH: 6 (ref 5.0–8.0)

## 2022-11-12 LAB — URINALYSIS, MICROSCOPIC (REFLEX)

## 2022-11-12 LAB — RESP PANEL BY RT-PCR (FLU A&B, COVID) ARPGX2
Influenza A by PCR: POSITIVE — AB
Influenza B by PCR: NEGATIVE
SARS Coronavirus 2 by RT PCR: NEGATIVE

## 2022-11-12 MED ORDER — CEPHALEXIN 500 MG PO CAPS: 500.0000 mg | ORAL_CAPSULE | Freq: Three times a day (TID) | ORAL | 0 refills | Status: AC

## 2022-11-12 MED ORDER — ACETAMINOPHEN 500 MG PO TABS: 1000.0000 mg | ORAL_TABLET | Freq: Four times a day (QID) | ORAL | 0 refills | Status: AC | PRN

## 2022-11-12 MED ORDER — FLUCONAZOLE 200 MG PO TABS: 200.0000 mg | ORAL_TABLET | Freq: Every day | ORAL | 0 refills | Status: AC

## 2022-11-12 MED ORDER — IBUPROFEN 800 MG PO TABS
800.0000 mg | ORAL_TABLET | Freq: Once | ORAL | Status: AC
  Administered 2022-11-12: 800 mg via ORAL
  Filled 2022-11-12: qty 1

## 2022-11-12 MED ORDER — IBUPROFEN 600 MG PO TABS: 600.0000 mg | ORAL_TABLET | Freq: Four times a day (QID) | ORAL | 0 refills | Status: AC | PRN

## 2022-11-12 MED ORDER — ACETAMINOPHEN 325 MG PO TABS
650.0000 mg | ORAL_TABLET | Freq: Once | ORAL | Status: AC
  Administered 2022-11-12: 650 mg via ORAL
  Filled 2022-11-12: qty 2

## 2022-11-12 NOTE — ED Triage Notes (Signed)
Patient states I think I have the flu, has had fever since yesterday, headache for the last 3 days, and reports body aches.

## 2022-11-12 NOTE — ED Notes (Signed)
PT advised it all started yesterday. Complained of standard body aches and cough, but also has a HA similar to her ocular migraines. Requested tx for same.

## 2022-11-12 NOTE — Discharge Instructions (Addendum)
You tested positive for Influenza A.  This is a viral illness please read the instructions below.  Your urine sample is in the gray zone of indicating a urinary tract infection.  I will obtain a urine culture that we will help to clarify whether you have a UTI.  The meantime we will go ahead and start you on an antibiotic called Keflex.  Take as prescribed until done with all tablets.  Follow-up with your primary care doctor  Please use Tylenol or ibuprofen for pain.  You may use 600 mg ibuprofen every 6 hours or 1000 mg of Tylenol every 6 hours.  You may choose to alternate between the 2.  This would be most effective.  Not to exceed 4 g of Tylenol within 24 hours.  Not to exceed 3200 mg ibuprofen 24 hours.    Viral Illness TREATMENT  Treatment is directed at relieving symptoms. There is no cure. Antibiotics are not effective, because the infection is caused by a virus, not by bacteria. Treatment may include:  Increased fluid intake. Sports drinks offer valuable electrolytes, sugars, and fluids.  Breathing heated mist or steam (vaporizer or shower).  Eating chicken soup or other clear broths, and maintaining good nutrition.  Getting plenty of rest.  Using gargles or lozenges for comfort.  Increasing usage of your inhaler if you have asthma.  Return to work when your temperature has returned to normal.  Gargle warm salt water and spit it out for sore throat. Take benadryl to decrease sinus secretions. Continue to alternate between Tylenol and ibuprofen for pain and fever control.  Follow Up: Follow up with your primary care doctor in 5-7 days for recheck of ongoing symptoms.  Return to emergency department for emergent changing or worsening of symptoms.

## 2022-11-12 NOTE — ED Provider Notes (Signed)
[facility] EMERGENCY DEPARTMENT Provider Note   CSN: [id number] Arrival date & time: 11/12/22  1549     History  Chief Complaint  Patient presents with   Cough    Shelly Cardenas is a 41 y.o. female.   Cough  Patient is a 41 year old female with history of anxiety and cystitis  She is present emergency room today with approximately 48 hours of cough congestion and fatigue myalgias.  She endorses some mild urinary discomfort she had 1 episode of dysuria.  Denies any chest pain or difficulty breathing.  No lightheadedness or dizziness.  She has not taken any antipyretics today.     Home Medications Prior to Admission medications   Medication Sig Start Date End Date Taking? Authorizing Provider  acetaminophen (TYLENOL) 500 MG tablet Take 2 tablets (1,000 mg total) by mouth every 6 (six) hours as needed. 11/12/22  Yes Eugune Sine S, PA  cephALEXin (KEFLEX) 500 MG capsule Take 1 capsule (500 mg total) by mouth 3 (three) times daily. 11/12/22  Yes Jerimey Burridge S, PA  fluconazole (DIFLUCAN) 200 MG tablet Take 1 tablet (200 mg total) by mouth daily for 1 dose. 11/12/22 11/13/22 Yes Gerda Yin S, PA  ibuprofen (ADVIL) 600 MG tablet Take 1 tablet (600 mg total) by mouth every 6 (six) hours as needed. 11/12/22  Yes Naomi Castrogiovanni, Rodrigo Ran, PA  ALPRAZolam (XANAX) 0.5 MG tablet Take 0.5 mg by mouth at bedtime as needed.    [provider]  Cyanocobalamin (VITAMIN B 12 PO) Take by mouth.    [provider]  Multiple Vitamins-Minerals (MULTIVITAMIN WITH MINERALS) tablet Take 1 tablet by mouth daily.    [provider]  traZODone (DESYREL) 50 MG tablet Take 1 tablet (50 mg total) by mouth at bedtime. 08/06/12 09/05/12  Laren Boom, DO      Allergies    Patient has no known allergies.    Review of Systems   Review of Systems  Respiratory:  Positive for cough.     Physical Exam Updated Vital Signs BP (!) 122/55 (BP Location: Right Arm)   Pulse (!)  120   Temp (!) 101.2 F (38.4 C) (Oral)   Resp 18   Ht 5\' 3"  (1.6 m)   Wt 65.8 kg   SpO2 96%   BMI 25.69 kg/m  Physical Exam Vitals and nursing note reviewed.  Constitutional:      General: She is not in acute distress.    Appearance: Normal appearance. She is not ill-appearing.  HENT:     Head: Normocephalic and atraumatic.     Nose: Nose normal.     Mouth/Throat:     Mouth: Mucous membranes are moist.  Eyes:     General: No scleral icterus.       Right eye: No discharge.        Left eye: No discharge.     Conjunctiva/sclera: Conjunctivae normal.  Cardiovascular:     Rate and Rhythm: Normal rate and regular rhythm.     Pulses: Normal pulses.     Heart sounds: Normal heart sounds.  Pulmonary:     Effort: Pulmonary effort is normal. No respiratory distress.     Breath sounds: No stridor. No wheezing.  Abdominal:     Palpations: Abdomen is soft.     Tenderness: There is no abdominal tenderness. There is no right CVA tenderness, left CVA tenderness, guarding or rebound.  Musculoskeletal:     Cervical back: Normal range of motion.  Right lower leg: No edema.     Left lower leg: No edema.  Skin:    General: Skin is warm and dry.     Capillary Refill: Capillary refill takes less than 2 seconds.  Neurological:     Mental Status: She is alert and oriented to person, place, and time. Mental status is at baseline.  Psychiatric:        Mood and Affect: Mood normal.        Behavior: Behavior normal.     ED Results / Procedures / Treatments   Labs (all labs ordered are listed, but only abnormal results are displayed) Labs Reviewed  RESP PANEL BY RT-PCR (FLU A&B, COVID) ARPGX2 - Abnormal; Notable for the following components:      Result Value   Influenza A by PCR POSITIVE (*)    All other components within normal limits  URINALYSIS, ROUTINE W REFLEX MICROSCOPIC - Abnormal; Notable for the following components:   APPearance CLOUDY (*)    Hgb urine dipstick LARGE (*)     Protein, ur 30 (*)    Nitrite POSITIVE (*)    All other components within normal limits  URINALYSIS, MICROSCOPIC (REFLEX) - Abnormal; Notable for the following components:   Bacteria, UA MANY (*)    All other components within normal limits  URINE CULTURE    EKG None  Radiology DG Chest 2 View  Result Date: 11/12/2022 CLINICAL DATA:  Cough. EXAM: CHEST - 2 VIEW COMPARISON:  None Available. FINDINGS: The heart size and mediastinal contours are within normal limits. Both lungs are clear. The visualized skeletal structures are unremarkable. IMPRESSION: No active cardiopulmonary disease. Electronically Signed   By: Darliss Cheney M.D.   On: 11/12/2022 16:38    Procedures Procedures    Medications Ordered in ED Medications  acetaminophen (TYLENOL) tablet 650 mg (650 mg Oral Given 11/12/22 1626)  ibuprofen (ADVIL) tablet 800 mg (800 mg Oral Given 11/12/22 1926)    ED Course/ Medical Decision Making/ A&P Clinical Course as of 11/12/22 2014  Sun Nov 12, 2022  1957 Yesterday and today cough, congestion fatigue.   [WF]  2000 Influenza A By PCR(!): POSITIVE [WF]    Clinical Course User Index [WF] Gailen Shelter, [place]                           Medical Decision Making Amount and/or Complexity of Data Reviewed Labs: ordered. Decision-making details documented in ED Course. Radiology: ordered.  Risk OTC drugs. Prescription drug management.   Patient is a 41 year old female with history of anxiety and cystitis  She is present emergency room today with approximately 48 hours of cough congestion and fatigue myalgias.  She endorses some mild urinary discomfort she had 1 episode of dysuria.  Denies any chest pain or difficulty breathing.  No lightheadedness or dizziness.  She has not taken any antipyretics today.   Urinalysis with many bacteria positive nitrates will obtain urine culture and treat empirically with Keflex.  Influenza A positive.  Chest x-ray unremarkable  Will  discharge home at this time with conservative therapy Tylenol ibuprofen, Keflex prescribed for empiric therapy for possible UTI very low suspicion for this however.  Return precautions discussed patient agreeable plan tolerating p.o. and fever improving after Tylenol here.  Will give an additional dose of ibuprofen prior to discharge  Final Clinical Impression(s) / ED Diagnoses Final diagnoses:  Influenza A    Rx / DC Orders ED Discharge  Orders          Ordered    cephALEXin (KEFLEX) 500 MG capsule  3 times daily        11/12/22 2005    acetaminophen (TYLENOL) 500 MG tablet  Every 6 hours PRN        11/12/22 2005    ibuprofen (ADVIL) 600 MG tablet  Every 6 hours PRN        11/12/22 2005    fluconazole (DIFLUCAN) 200 MG tablet  Daily        11/12/22 2013              Solon Augusta [place], [place] 11/12/22 2015    Melene Plan, DO 11/12/22 2109

## 2022-11-13 ENCOUNTER — Encounter (HOSPITAL_BASED_OUTPATIENT_CLINIC_OR_DEPARTMENT_OTHER): Payer: Self-pay | Admitting: Emergency Medicine

## 2022-11-15 LAB — URINE CULTURE: Culture: >=100000 — AB

## 2022-11-16 ENCOUNTER — Telehealth (HOSPITAL_BASED_OUTPATIENT_CLINIC_OR_DEPARTMENT_OTHER): Payer: Self-pay | Admitting: *Deleted

## 2022-11-16 NOTE — Telephone Encounter (Signed)
Post ED Visit - Positive Culture Follow-up  Culture report reviewed by antimicrobial stewardship pharmacist: [facility] []  , Pharm.D. []  Enzo Bi, Pharm.D., BCPS AQ-ID []  , Pharm.D., BCPS []  Celedonio Miyamoto, .D., BCPS []  [place], .D., BCPS, AAHIVP []  [place]ina Pillion, Pharm.D., BCPS, AAHIVP []  [address], PharmD, BCPS []  , PharmD, BCPS []  [place], PharmD, BCPS []  [address], PharmD []  , PharmD, BCPS []  Estella Husk, PharmD  Pharmacy Team []  Lysle Pearl, PharmD []  , PharmD []  Phillips Climes, PharmD []  , Rph []  Agapito Games) , PharmD []  Verlan Friends, PharmD []  , PharmD []  Mervyn Gay, PharmD []  , PharmD []  Vinnie Level, PharmD []  [facility], PharmD []  , PharmD []  Len Childs, PharmD   Positive urine culture Treated with Cephalexin, organism sensitive to the same and no further patient follow-up is required at this time.  , Pharm D  Greer Pickerel Talley 11/16/2022, 1:40 PM

## 2024-08-14 ENCOUNTER — Encounter: Payer: Self-pay | Admitting: Sports Medicine

## 2024-08-18 NOTE — Telephone Encounter (Signed)
 No openings at this time.

## 2024-09-28 ENCOUNTER — Ambulatory Visit (HOSPITAL_COMMUNITY)
Admission: EM | Admit: 2024-09-28 | Discharge: 2024-09-28 | Disposition: A | Discharge status: Home / Self Care | Source: Ambulatory Visit | Attending: Emergency Medicine | Admitting: Emergency Medicine

## 2024-09-28 ENCOUNTER — Emergency Department (HOSPITAL_COMMUNITY)
Admission: EM | Admit: 2024-09-28 | Discharge: 2024-09-28 | Disposition: A | Discharge status: Home / Self Care | Attending: Emergency Medicine | Admitting: Emergency Medicine

## 2024-09-28 DIAGNOSIS — Z0441 Encounter for examination and observation following alleged adult rape: Secondary | ICD-10-CM | POA: Diagnosis present

## 2024-09-28 DIAGNOSIS — T7421XA Adult sexual abuse, confirmed, initial encounter: Secondary | ICD-10-CM | POA: Insufficient documentation

## 2024-09-28 MED ORDER — BICTEGRAVIR-EMTRICITAB-TENOFOV 50-200-25 MG PO PREPACK: 1.0000 | ORAL_TABLET | Freq: Every day | ORAL | Status: DC
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
## 2024-09-28 NOTE — SANE Note (Signed)
  The SANE / Insurance risk surveyor consult has been completed. The ED Provider, Leita Chancy, PA-C has been notified.  Please contact the SANE/FNE nurse on call listed in AMiON with any further concerns.
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
